# Patient Record
Sex: Female | Born: 1945 | Race: White | Hispanic: No | Marital: Married | Smoking: Never smoker
Health system: Southern US, Community
[De-identification: ages and names within clinical notes are randomized; demographics above are authoritative.]

## PROBLEM LIST (undated history)

## (undated) DIAGNOSIS — E039 Hypothyroidism, unspecified: Secondary | ICD-10-CM

## (undated) DIAGNOSIS — F419 Anxiety disorder, unspecified: Secondary | ICD-10-CM

## (undated) DIAGNOSIS — M1990 Unspecified osteoarthritis, unspecified site: Secondary | ICD-10-CM

## (undated) DIAGNOSIS — K219 Gastro-esophageal reflux disease without esophagitis: Secondary | ICD-10-CM

## (undated) DIAGNOSIS — H353190 Nonexudative age-related macular degeneration, unspecified eye, stage unspecified: Secondary | ICD-10-CM

## (undated) DIAGNOSIS — I10 Essential (primary) hypertension: Secondary | ICD-10-CM

## (undated) DIAGNOSIS — R112 Nausea with vomiting, unspecified: Secondary | ICD-10-CM

## (undated) DIAGNOSIS — Z9889 Other specified postprocedural states: Secondary | ICD-10-CM

## (undated) HISTORY — PX: MENISCUS REPAIR: SHX5179

## (undated) HISTORY — PX: KNEE ARTHROSCOPY: SHX127

## (undated) HISTORY — PX: TUBAL LIGATION: SHX77

---

## 1980-09-24 HISTORY — PX: TUBAL LIGATION: SHX77

## 2011-04-26 ENCOUNTER — Encounter: Payer: Self-pay | Admitting: Podiatry

## 2012-09-24 HISTORY — PX: BUNIONECTOMY: SHX129

## 2013-10-13 ENCOUNTER — Other Ambulatory Visit: Payer: Self-pay | Admitting: Orthopedic Surgery

## 2013-10-23 ENCOUNTER — Encounter (HOSPITAL_COMMUNITY): Payer: Self-pay | Admitting: Pharmacy Technician

## 2013-10-26 ENCOUNTER — Encounter (INDEPENDENT_AMBULATORY_CARE_PROVIDER_SITE_OTHER): Payer: Self-pay

## 2013-10-26 ENCOUNTER — Encounter (HOSPITAL_COMMUNITY): Payer: Self-pay

## 2013-10-26 ENCOUNTER — Encounter (HOSPITAL_COMMUNITY)
Admission: RE | Admit: 2013-10-26 | Discharge: 2013-10-26 | Disposition: A | Discharge status: Home / Self Care | Payer: Medicare Other | Source: Ambulatory Visit | Attending: Orthopedic Surgery | Admitting: Orthopedic Surgery

## 2013-10-26 DIAGNOSIS — Z01812 Encounter for preprocedural laboratory examination: Secondary | ICD-10-CM | POA: Insufficient documentation

## 2013-10-26 HISTORY — DX: Nonexudative age-related macular degeneration, unspecified eye, stage unspecified: H35.3190

## 2013-10-26 HISTORY — DX: Other specified postprocedural states: R11.2

## 2013-10-26 HISTORY — DX: Gastro-esophageal reflux disease without esophagitis: K21.9

## 2013-10-26 HISTORY — DX: Other specified postprocedural states: Z98.890

## 2013-10-26 HISTORY — DX: Hypothyroidism, unspecified: E03.9

## 2013-10-26 HISTORY — DX: Anxiety disorder, unspecified: F41.9

## 2013-10-26 HISTORY — DX: Unspecified osteoarthritis, unspecified site: M19.90

## 2013-10-26 LAB — URINALYSIS, ROUTINE W REFLEX MICROSCOPIC
Bilirubin Urine: NEGATIVE
Glucose, UA: NEGATIVE mg/dL
Hgb urine dipstick: NEGATIVE
Ketones, ur: NEGATIVE mg/dL
NITRITE: NEGATIVE
PH: 7.5 (ref 5.0–8.0)
Protein, ur: NEGATIVE mg/dL
SPECIFIC GRAVITY, URINE: 1.009 (ref 1.005–1.030)
Urobilinogen, UA: 0.2 mg/dL (ref 0.0–1.0)

## 2013-10-26 LAB — COMPREHENSIVE METABOLIC PANEL
ALBUMIN: 3.9 g/dL (ref 3.5–5.2)
ALK PHOS: 82 U/L (ref 39–117)
ALT: 21 U/L (ref 0–35)
AST: 26 U/L (ref 0–37)
BILIRUBIN TOTAL: 0.3 mg/dL (ref 0.3–1.2)
BUN: 11 mg/dL (ref 6–23)
CHLORIDE: 103 meq/L (ref 96–112)
CO2: 28 mEq/L (ref 19–32)
Calcium: 9.3 mg/dL (ref 8.4–10.5)
Creatinine, Ser: 0.65 mg/dL (ref 0.50–1.10)
GFR calc Af Amer: >90 mL/min (ref >90)
GFR calc non Af Amer: 89 mL/min — ABNORMAL LOW (ref >90)
Glucose, Bld: 91 mg/dL (ref 70–99)
POTASSIUM: 4.2 meq/L (ref 3.7–5.3)
SODIUM: 142 meq/L (ref 137–147)
Total Protein: 6.9 g/dL (ref 6.0–8.3)

## 2013-10-26 LAB — PROTIME-INR
INR: 0.93 (ref 0.00–1.49)
Prothrombin Time: 12.3 seconds (ref 11.6–15.2)

## 2013-10-26 LAB — CBC
HCT: 38.3 % (ref 36.0–46.0)
HEMOGLOBIN: 12.6 g/dL (ref 12.0–15.0)
MCH: 29.7 pg (ref 26.0–34.0)
MCHC: 32.9 g/dL (ref 30.0–36.0)
MCV: 90.3 fL (ref 78.0–100.0)
Platelets: 247 10*3/uL (ref 150–400)
RBC: 4.24 MIL/uL (ref 3.87–5.11)
RDW: 13 % (ref 11.5–15.5)
WBC: 5.8 10*3/uL (ref 4.0–10.5)

## 2013-10-26 LAB — SURGICAL PCR SCREEN
MRSA, PCR: NEGATIVE
Staphylococcus aureus: NEGATIVE

## 2013-10-26 LAB — URINE MICROSCOPIC-ADD ON

## 2013-10-26 LAB — APTT: aPTT: 26 seconds (ref 24–37)

## 2013-10-26 LAB — ABO/RH: ABO/RH(D): O POS

## 2013-10-26 NOTE — Patient Instructions (Addendum)
[address] Hollace HaywardSusan Clermont  10/26/2013   Your procedure is scheduled on: 11/02/13  [facility] at 09:40 AM.  Call this number if you have problems the morning of surgery 336-: [phone]   Remember: NO VISITORS UNDER AGE 69 DUE TO [place] POLICY   Do not eat food or drink liquids After Midnight.     Take these medicines the morning of surgery with A SIP OF WATER: prozac, levothyroxine   Do not wear jewelry, make-up or nail polish.  Do not wear lotions, powders, or perfumes. You may wear deodorant.  Do not shave 48 hours prior to surgery. Men may shave face and neck.  Do not bring valuables to the hospital.  Contacts, dentures or bridgework may not be worn into surgery.  Leave suitcase in the car. After surgery it may be brought to your room.  For patients admitted to the hospital, checkout time is 11:00 AM the day of discharge.   Please read over the following fact sheets that you were given: MRSA Information, incentive spirometry fact sheet, blood fact sheet Birdie Sonsachel Joia Doyle, RN  pre op nurse call if needed [phone]    FAILURE TO FOLLOW THESE INSTRUCTIONS MAY RESULT IN CANCELLATION OF YOUR SURGERY   Patient Signature: ___________________________________________

## 2013-10-26 NOTE — Progress Notes (Signed)
ekg 12/15/12 on chart, surgery clearance note 10/13/13 Dr. Fanny DanceVelasquez on chart

## 2013-11-01 ENCOUNTER — Other Ambulatory Visit: Payer: Self-pay | Admitting: Orthopedic Surgery

## 2013-11-01 NOTE — H&P (Signed)
Cathy HaywardSusan Fitzpatrick  DOB: 01/26/1946 Married / Language: Lenox Ponds[place] / Race: White Female  Date of Admission:  11-02-2013  Chief Complaint:  Left Knee Pain    History of Present Illness The patient is a 68 year old female who comes in for a preoperative History and Physical. The patient is scheduled for a left total knee arthroplasty to be performed by Dr. Gus RankinFrank V. Aluisio, MD at [facility] on 11-02-2013. The patient is a 68 year old female who presents for follow up of their knee. The patient is being followed for their left knee pain and osteoarthritis. They are now several month(s) out from the last cortisone injection. Symptoms reported today include: pain. The patient has reported improvement of their symptoms with: Cortisone injections (the last one did not help for long).   Cathy PikesSusan states the left knee is getting progressively worse over time. The cortisone provides a few weeks of benefit generally, maybe up to a month or so. She is at a stage now where the knee is inhibiting what she can and can not do. She feels as though the pain is getting progressively worse. It does hurt her at night. She is at a stage where she is ready for more permanent solution to this problem. She is ready to get the knee fixed. They have been treated conservatively in the past for the above stated problem and despite conservative measures, they continue to have progressive pain and severe functional limitations and dysfunction. They have failed non-operative management including home exercise, medications, and injections. It is felt that they would benefit from undergoing total joint replacement. Risks and benefits of the procedure have been discussed with the patient and they elect to proceed with surgery. There are no active contraindications to surgery such as ongoing infection or rapidly progressive neurological disease.  Allergies Codeine Sulfate *ANALGESICS - OPIOID*. Sickness - Please Note that the  patient IS able to take hydrocodone and oxycodone.   Problem List/Past Medical Primary osteoarthritis of one knee (715.16) Hypothyroidism Anxiety Disorder Macular Degeneration Degenerative Disc Disease    Family History Depression. mother Cancer. First Degree Relatives. mother and father Hypertension. sister Father. Deceased. age 69, Aspiration Pneunomia Mother. Deceased. age 69, Ruptured AAA, History of Renal Cancer    Social History Drug/Alcohol Rehab (Currently). no Illicit drug use. no Exercise. Exercises rarely Living situation. live with spouse Tobacco use. Never smoker. never smoker Number of flights of stairs before winded. 4-5 Marital status. married Previously in rehab. no Children. 0 Alcohol use. current drinker; drinks wine; less than 5 per week Pain Contract. no Current work status. retired    Medication History Aleve (prn) Active. Levothyroxine .025mg  Active. Fluoxetine 20mg  Active. Amitriptyline 25mg  Active. Tozal 3 caplets/day Active.   Past Surgical History Foot Surgery. bilateral   Review of Systems General:Not Present- Chills, Fever, Night Sweats, Fatigue, Weight Gain, Weight Loss and Memory Loss. Skin:Not Present- Hives, Itching, Rash, Eczema and Lesions. HEENT:Not Present- Tinnitus, Headache, Double Vision, Visual Loss, Hearing Loss and Dentures. Respiratory:Not Present- Shortness of breath with exertion, Shortness of breath at rest, Allergies, Coughing up blood and Chronic Cough. Cardiovascular:Not Present- Chest Pain, Racing/skipping heartbeats, Difficulty Breathing Lying Down, Murmur, Swelling and Palpitations. Gastrointestinal:Not Present- Bloody Stool, Heartburn, Abdominal Pain, Vomiting, Nausea, Constipation, Diarrhea, Difficulty Swallowing, Jaundice and Loss of appetitie. Female Genitourinary:Not Present- Blood in Urine, Urinary frequency, Weak urinary stream, Discharge, Flank Pain, Incontinence,  Painful Urination, Urgency, Urinary Retention and Urinating at Night. Musculoskeletal:Present- Joint Pain. Not Present- Muscle Weakness, Muscle Pain,  Joint Swelling, Back Pain, Morning Stiffness and Spasms. Neurological:Not Present- Tremor, Dizziness, Blackout spells, Paralysis, Difficulty with balance and Weakness. Psychiatric:Not Present- Insomnia.    Vitals Weight: 165 lb Height: 66 in Body Surface Area: 1.87 m Body Mass Index: 26.63 kg/m Pulse: 84 (Regular) Resp.: 16 (Unlabored) BP: 152/78 (Sitting, Right Arm, Standard)     Physical Exam The physical exam findings are as follows:   General Mental Status - Alert, cooperative and good historian. General Appearance- pleasant. Not in acute distress. Orientation- Oriented X3. Build & Nutrition- Well nourished and Well developed.   Head and Neck Head- normocephalic, atraumatic . Neck Global Assessment- supple. no bruit auscultated on the right and no bruit auscultated on the left.   Eye Vision- Wears corrective lenses. Pupil- Bilateral- Regular and Round. Motion- Bilateral- EOMI.   Chest and Lung Exam Auscultation: Breath sounds:- clear at anterior chest wall and - clear at posterior chest wall. Adventitious sounds:- No Adventitious sounds.   Cardiovascular Auscultation:Rhythm- Regular rate and rhythm. Heart Sounds- S1 WNL and S2 WNL. Murmurs & Other Heart Sounds:Auscultation of the heart reveals - No Murmurs.   Abdomen Palpation/Percussion:Tenderness- Abdomen is non-tender to palpation. Rigidity (guarding)- Abdomen is soft. Auscultation:Auscultation of the abdomen reveals - Bowel sounds normal.   Female Genitourinary Not done, not pertinent to present illness  Musculoskeletal Well developed female alert and oriented in no apparent distress. Evaluation of her left hip shows normal range of motion, no discomfort. The left knee no effusion. Slight varus deformity.  There is moderate crepitus on range of motion of the knee. She is tender medial greater than lateral. There is no instability noted about the knee. Pulses, sensation and motor are intact distally.  RADIOGRAPHS: Review of radiographs AP both knees and lateral of the left taken today show she has bone on bone arthritis medial and patellofemoral compartments of the left knee with varus deformity.   Assessment & Plan Primary osteoarthritis of one knee (715.16) Impression: Left Knee  Note: Plan is for a Left Total Knee Replacement by Dr. Lequita Halt.  Plan is to go home.  PCP - Dr. Guerry Bruin  The patient does not have any contraindications and will receive TXA (tranexamic acid) prior to surgery.  Signed electronically by Lauraine Rinne, III PA-C

## 2013-11-02 ENCOUNTER — Encounter (HOSPITAL_COMMUNITY)
Admission: RE | Disposition: A | Discharge status: Home Health | Payer: Self-pay | Source: Ambulatory Visit | Attending: Orthopedic Surgery

## 2013-11-02 ENCOUNTER — Encounter (HOSPITAL_COMMUNITY): Payer: Medicare Other | Admitting: Anesthesiology

## 2013-11-02 ENCOUNTER — Inpatient Hospital Stay (HOSPITAL_COMMUNITY): Payer: Medicare Other | Admitting: Anesthesiology

## 2013-11-02 ENCOUNTER — Inpatient Hospital Stay (HOSPITAL_COMMUNITY)
Admission: RE | Admit: 2013-11-02 | Discharge: 2013-11-04 | DRG: 470 | Disposition: A | Discharge status: Home Health | Payer: Medicare Other | Source: Ambulatory Visit | Attending: Orthopedic Surgery | Admitting: Orthopedic Surgery

## 2013-11-02 ENCOUNTER — Encounter (HOSPITAL_COMMUNITY): Payer: Self-pay | Admitting: *Deleted

## 2013-11-02 DIAGNOSIS — K219 Gastro-esophageal reflux disease without esophagitis: Secondary | ICD-10-CM | POA: Diagnosis present

## 2013-11-02 DIAGNOSIS — F411 Generalized anxiety disorder: Secondary | ICD-10-CM | POA: Diagnosis present

## 2013-11-02 DIAGNOSIS — E039 Hypothyroidism, unspecified: Secondary | ICD-10-CM | POA: Diagnosis present

## 2013-11-02 DIAGNOSIS — H3530 Unspecified macular degeneration: Secondary | ICD-10-CM | POA: Diagnosis present

## 2013-11-02 DIAGNOSIS — M1710 Unilateral primary osteoarthritis, unspecified knee: Principal | ICD-10-CM | POA: Diagnosis present

## 2013-11-02 DIAGNOSIS — D62 Acute posthemorrhagic anemia: Secondary | ICD-10-CM | POA: Diagnosis not present

## 2013-11-02 DIAGNOSIS — Z8249 Family history of ischemic heart disease and other diseases of the circulatory system: Secondary | ICD-10-CM

## 2013-11-02 DIAGNOSIS — Z96652 Presence of left artificial knee joint: Secondary | ICD-10-CM

## 2013-11-02 DIAGNOSIS — Z79899 Other long term (current) drug therapy: Secondary | ICD-10-CM

## 2013-11-02 DIAGNOSIS — E871 Hypo-osmolality and hyponatremia: Secondary | ICD-10-CM | POA: Diagnosis not present

## 2013-11-02 DIAGNOSIS — M179 Osteoarthritis of knee, unspecified: Secondary | ICD-10-CM | POA: Diagnosis present

## 2013-11-02 HISTORY — PX: TOTAL KNEE ARTHROPLASTY: SHX125

## 2013-11-02 LAB — TYPE AND SCREEN
ABO/RH(D): O POS
ANTIBODY SCREEN: NEGATIVE

## 2013-11-02 SURGERY — ARTHROPLASTY, KNEE, TOTAL
Anesthesia: Spinal | Site: Knee | Laterality: Left

## 2013-11-02 MED ORDER — BUPIVACAINE HCL (PF) 0.25 % IJ SOLN
INTRAMUSCULAR | Status: AC
  Filled 2013-11-02: qty 30

## 2013-11-02 MED ORDER — ACETAMINOPHEN 500 MG PO TABS
1000.0000 mg | ORAL_TABLET | Freq: Four times a day (QID) | ORAL | Status: AC
  Administered 2013-11-02 – 2013-11-03 (×4): 1000 mg via ORAL
  Filled 2013-11-02 (×5): qty 2

## 2013-11-02 MED ORDER — KETAMINE HCL 10 MG/ML IJ SOLN
INTRAMUSCULAR | Status: DC | PRN
  Administered 2013-11-02 (×2): 10 mg via INTRAVENOUS

## 2013-11-02 MED ORDER — PNEUMOCOCCAL VAC POLYVALENT 25 MCG/0.5ML IJ INJ
0.5000 mL | INJECTION | INTRAMUSCULAR | Status: DC
  Filled 2013-11-02 (×2): qty 0.5

## 2013-11-02 MED ORDER — PHENOL 1.4 % MT LIQD: 1.0000 | OROMUCOSAL | Status: DC | PRN

## 2013-11-02 MED ORDER — PROPOFOL 10 MG/ML IV EMUL
INTRAVENOUS | Status: DC | PRN
  Administered 2013-11-02: 20 mg via INTRAVENOUS

## 2013-11-02 MED ORDER — RIVAROXABAN 10 MG PO TABS
10.0000 mg | ORAL_TABLET | Freq: Every day | ORAL | Status: DC
  Administered 2013-11-03 – 2013-11-04 (×2): 10 mg via ORAL
  Filled 2013-11-02 (×3): qty 1

## 2013-11-02 MED ORDER — KETOROLAC TROMETHAMINE 15 MG/ML IJ SOLN
7.5000 mg | Freq: Four times a day (QID) | INTRAMUSCULAR | Status: AC | PRN
  Administered 2013-11-02 (×2): 7.5 mg via INTRAVENOUS
  Filled 2013-11-02: qty 1

## 2013-11-02 MED ORDER — SODIUM CHLORIDE 0.9 % IV SOLN: INTRAVENOUS | Status: DC

## 2013-11-02 MED ORDER — BUPIVACAINE LIPOSOME 1.3 % IJ SUSP
20.0000 mL | Freq: Once | INTRAMUSCULAR | Status: DC
Start: 2013-11-02 — End: 2013-11-02
  Filled 2013-11-02: qty 20

## 2013-11-02 MED ORDER — LACTATED RINGERS IV SOLN: INTRAVENOUS | Status: DC

## 2013-11-02 MED ORDER — PROPOFOL 10 MG/ML IV BOLUS
INTRAVENOUS | Status: AC
  Filled 2013-11-02: qty 20

## 2013-11-02 MED ORDER — MIDAZOLAM HCL 2 MG/2ML IJ SOLN
INTRAMUSCULAR | Status: AC
  Filled 2013-11-02: qty 2

## 2013-11-02 MED ORDER — TRANEXAMIC ACID 100 MG/ML IV SOLN
1000.0000 mg | INTRAVENOUS | Status: AC
  Administered 2013-11-02: 1000 mg via INTRAVENOUS
  Filled 2013-11-02: qty 10

## 2013-11-02 MED ORDER — BUPIVACAINE HCL (PF) 0.75 % IJ SOLN
INTRAMUSCULAR | Status: DC | PRN
  Administered 2013-11-02: 15 mg via INTRATHECAL

## 2013-11-02 MED ORDER — LIDOCAINE HCL (CARDIAC) 20 MG/ML IV SOLN
INTRAVENOUS | Status: AC
  Filled 2013-11-02: qty 5

## 2013-11-02 MED ORDER — DEXTROSE-NACL 5-0.45 % IV SOLN
INTRAVENOUS | Status: DC
  Administered 2013-11-02 – 2013-11-03 (×2): via INTRAVENOUS

## 2013-11-02 MED ORDER — DEXAMETHASONE 6 MG PO TABS
10.0000 mg | ORAL_TABLET | Freq: Every day | ORAL | Status: AC
  Filled 2013-11-02: qty 1

## 2013-11-02 MED ORDER — MENTHOL 3 MG MT LOZG
1.0000 | LOZENGE | OROMUCOSAL | Status: DC | PRN
Start: 2013-11-02 — End: 2013-11-04

## 2013-11-02 MED ORDER — METHOCARBAMOL 500 MG PO TABS
500.0000 mg | ORAL_TABLET | Freq: Four times a day (QID) | ORAL | Status: DC | PRN
  Administered 2013-11-02 – 2013-11-04 (×6): 500 mg via ORAL
  Filled 2013-11-02 (×6): qty 1

## 2013-11-02 MED ORDER — CEFAZOLIN SODIUM-DEXTROSE 2-3 GM-% IV SOLR
2.0000 g | INTRAVENOUS | Status: AC
  Administered 2013-11-02: 2 g via INTRAVENOUS

## 2013-11-02 MED ORDER — BISACODYL 10 MG RE SUPP: 10.0000 mg | Freq: Every day | RECTAL | Status: DC | PRN

## 2013-11-02 MED ORDER — CEFAZOLIN SODIUM-DEXTROSE 2-3 GM-% IV SOLR
2.0000 g | Freq: Four times a day (QID) | INTRAVENOUS | Status: AC
  Administered 2013-11-02 – 2013-11-03 (×2): 2 g via INTRAVENOUS
  Filled 2013-11-02 (×2): qty 50

## 2013-11-02 MED ORDER — ACETAMINOPHEN 500 MG PO TABS
1000.0000 mg | ORAL_TABLET | Freq: Once | ORAL | Status: AC
  Administered 2013-11-02: 1000 mg via ORAL
  Filled 2013-11-02: qty 2

## 2013-11-02 MED ORDER — BUPIVACAINE LIPOSOME 1.3 % IJ SUSP
INTRAMUSCULAR | Status: DC | PRN
  Administered 2013-11-02: 20 mL

## 2013-11-02 MED ORDER — SODIUM CHLORIDE 0.9 % IJ SOLN
INTRAMUSCULAR | Status: DC | PRN
  Administered 2013-11-02: 30 mL

## 2013-11-02 MED ORDER — FLEET ENEMA 7-19 GM/118ML RE ENEM: 1.0000 | ENEMA | Freq: Once | RECTAL | Status: AC | PRN

## 2013-11-02 MED ORDER — METOCLOPRAMIDE HCL 10 MG PO TABS
5.0000 mg | ORAL_TABLET | Freq: Three times a day (TID) | ORAL | Status: DC | PRN
  Administered 2013-11-04: 10 mg via ORAL
  Filled 2013-11-02: qty 1

## 2013-11-02 MED ORDER — ONDANSETRON HCL 4 MG PO TABS
4.0000 mg | ORAL_TABLET | Freq: Four times a day (QID) | ORAL | Status: DC | PRN
  Administered 2013-11-04: 4 mg via ORAL
  Filled 2013-11-02: qty 1

## 2013-11-02 MED ORDER — METHOCARBAMOL 100 MG/ML IJ SOLN
500.0000 mg | Freq: Four times a day (QID) | INTRAVENOUS | Status: DC | PRN
  Administered 2013-11-02: 500 mg via INTRAVENOUS
  Filled 2013-11-02: qty 5

## 2013-11-02 MED ORDER — DOCUSATE SODIUM 100 MG PO CAPS
100.0000 mg | ORAL_CAPSULE | Freq: Two times a day (BID) | ORAL | Status: DC
  Administered 2013-11-02 – 2013-11-04 (×4): 100 mg via ORAL

## 2013-11-02 MED ORDER — ACETAMINOPHEN 650 MG RE SUPP: 650.0000 mg | Freq: Four times a day (QID) | RECTAL | Status: DC | PRN

## 2013-11-02 MED ORDER — HYDROMORPHONE HCL PF 1 MG/ML IJ SOLN
0.2500 mg | INTRAMUSCULAR | Status: DC | PRN
  Administered 2013-11-02 (×4): 0.5 mg via INTRAVENOUS

## 2013-11-02 MED ORDER — MIDAZOLAM HCL 5 MG/5ML IJ SOLN
INTRAMUSCULAR | Status: DC | PRN
  Administered 2013-11-02: 2 mg via INTRAVENOUS

## 2013-11-02 MED ORDER — BUPIVACAINE HCL 0.25 % IJ SOLN
INTRAMUSCULAR | Status: DC | PRN
  Administered 2013-11-02: 20 mL

## 2013-11-02 MED ORDER — HYDROMORPHONE HCL PF 1 MG/ML IJ SOLN
INTRAMUSCULAR | Status: AC
  Filled 2013-11-02: qty 1

## 2013-11-02 MED ORDER — CHLORHEXIDINE GLUCONATE CLOTH 2 % EX PADS: 6.0000 | MEDICATED_PAD | Freq: Once | CUTANEOUS | Status: DC

## 2013-11-02 MED ORDER — LEVOTHYROXINE SODIUM 25 MCG PO TABS
25.0000 ug | ORAL_TABLET | Freq: Every day | ORAL | Status: DC
  Administered 2013-11-03 – 2013-11-04 (×2): 25 ug via ORAL
  Filled 2013-11-02 (×3): qty 1

## 2013-11-02 MED ORDER — SODIUM CHLORIDE 0.9 % IJ SOLN
INTRAMUSCULAR | Status: AC
  Filled 2013-11-02: qty 50

## 2013-11-02 MED ORDER — PROPOFOL INFUSION 10 MG/ML OPTIME
INTRAVENOUS | Status: DC | PRN
  Administered 2013-11-02: 75 ug/kg/min via INTRAVENOUS

## 2013-11-02 MED ORDER — CEFAZOLIN SODIUM-DEXTROSE 2-3 GM-% IV SOLR
INTRAVENOUS | Status: AC
  Filled 2013-11-02: qty 50

## 2013-11-02 MED ORDER — ACETAMINOPHEN 325 MG PO TABS: 650.0000 mg | ORAL_TABLET | Freq: Four times a day (QID) | ORAL | Status: DC | PRN

## 2013-11-02 MED ORDER — DIPHENHYDRAMINE HCL 12.5 MG/5ML PO ELIX: 12.5000 mg | ORAL_SOLUTION | ORAL | Status: DC | PRN

## 2013-11-02 MED ORDER — DEXAMETHASONE SODIUM PHOSPHATE 10 MG/ML IJ SOLN
INTRAMUSCULAR | Status: AC
  Filled 2013-11-02: qty 1

## 2013-11-02 MED ORDER — ONDANSETRON HCL 4 MG/2ML IJ SOLN
4.0000 mg | Freq: Four times a day (QID) | INTRAMUSCULAR | Status: DC | PRN
  Administered 2013-11-03: 4 mg via INTRAVENOUS
  Filled 2013-11-02: qty 2

## 2013-11-02 MED ORDER — KETOROLAC TROMETHAMINE 15 MG/ML IJ SOLN
INTRAMUSCULAR | Status: AC
  Filled 2013-11-02: qty 1

## 2013-11-02 MED ORDER — LACTATED RINGERS IV SOLN
INTRAVENOUS | Status: DC | PRN
  Administered 2013-11-02 (×2): via INTRAVENOUS

## 2013-11-02 MED ORDER — DEXAMETHASONE SODIUM PHOSPHATE 10 MG/ML IJ SOLN
10.0000 mg | Freq: Every day | INTRAMUSCULAR | Status: AC
  Administered 2013-11-03: 10 mg via INTRAVENOUS
  Filled 2013-11-02: qty 1

## 2013-11-02 MED ORDER — FLUOXETINE HCL 20 MG PO TABS
20.0000 mg | ORAL_TABLET | Freq: Every morning | ORAL | Status: DC
  Administered 2013-11-03 – 2013-11-04 (×2): 20 mg via ORAL
  Filled 2013-11-02 (×2): qty 1

## 2013-11-02 MED ORDER — METOCLOPRAMIDE HCL 5 MG/ML IJ SOLN: 5.0000 mg | Freq: Three times a day (TID) | INTRAMUSCULAR | Status: DC | PRN

## 2013-11-02 MED ORDER — POLYETHYLENE GLYCOL 3350 17 G PO PACK: 17.0000 g | PACK | Freq: Every day | ORAL | Status: DC | PRN

## 2013-11-02 MED ORDER — OXYCODONE HCL 5 MG PO TABS
5.0000 mg | ORAL_TABLET | ORAL | Status: DC | PRN
  Administered 2013-11-02 – 2013-11-03 (×8): 10 mg via ORAL
  Filled 2013-11-02 (×8): qty 2

## 2013-11-02 MED ORDER — ONDANSETRON HCL 4 MG/2ML IJ SOLN
INTRAMUSCULAR | Status: DC | PRN
  Administered 2013-11-02: 4 mg via INTRAVENOUS

## 2013-11-02 MED ORDER — MORPHINE SULFATE 2 MG/ML IJ SOLN
1.0000 mg | INTRAMUSCULAR | Status: DC | PRN
  Administered 2013-11-02 – 2013-11-03 (×2): 2 mg via INTRAVENOUS
  Filled 2013-11-02 (×2): qty 1

## 2013-11-02 MED ORDER — DEXAMETHASONE SODIUM PHOSPHATE 10 MG/ML IJ SOLN
10.0000 mg | Freq: Once | INTRAMUSCULAR | Status: AC
  Administered 2013-11-02: 10 mg via INTRAVENOUS

## 2013-11-02 MED ORDER — AMITRIPTYLINE HCL 25 MG PO TABS
25.0000 mg | ORAL_TABLET | Freq: Every day | ORAL | Status: DC
  Administered 2013-11-02 – 2013-11-03 (×2): 25 mg via ORAL
  Filled 2013-11-02 (×3): qty 1

## 2013-11-02 MED ORDER — ONDANSETRON HCL 4 MG/2ML IJ SOLN
INTRAMUSCULAR | Status: AC
  Filled 2013-11-02: qty 2

## 2013-11-02 SURGICAL SUPPLY — 55 items
BAG ZIPLOCK 12X15 (MISCELLANEOUS) ×3 IMPLANT
BANDAGE ELASTIC 6 VELCRO ST LF (GAUZE/BANDAGES/DRESSINGS) ×3 IMPLANT
BANDAGE ESMARK 6X9 LF (GAUZE/BANDAGES/DRESSINGS) ×1 IMPLANT
BENZOIN TINCTURE PRP APPL 2/3 (GAUZE/BANDAGES/DRESSINGS) ×3 IMPLANT
BLADE SAG 18X100X1.27 (BLADE) ×3 IMPLANT
BLADE SAW SGTL 11.0X1.19X90.0M (BLADE) ×3 IMPLANT
BNDG ESMARK 6X9 LF (GAUZE/BANDAGES/DRESSINGS) ×3
BOWL SMART MIX CTS (DISPOSABLE) ×3 IMPLANT
CAPT RP KNEE ×3 IMPLANT
CEMENT HV SMART SET (Cement) ×6 IMPLANT
CLOSURE WOUND 1/2 X4 (GAUZE/BANDAGES/DRESSINGS) ×1
CUFF TOURN SGL QUICK 34 (TOURNIQUET CUFF) ×2
CUFF TRNQT CYL 34X4X40X1 (TOURNIQUET CUFF) ×1 IMPLANT
DECANTER SPIKE VIAL GLASS SM (MISCELLANEOUS) ×3 IMPLANT
DRAPE EXTREMITY T 121X128X90 (DRAPE) ×3 IMPLANT
DRAPE POUCH INSTRU U-SHP 10X18 (DRAPES) ×3 IMPLANT
DRAPE U-SHAPE 47X51 STRL (DRAPES) ×3 IMPLANT
DRSG ADAPTIC 3X8 NADH LF (GAUZE/BANDAGES/DRESSINGS) ×3 IMPLANT
DURAPREP 26ML APPLICATOR (WOUND CARE) ×3 IMPLANT
ELECT REM PT RETURN 9FT ADLT (ELECTROSURGICAL) ×3
ELECTRODE REM PT RTRN 9FT ADLT (ELECTROSURGICAL) ×1 IMPLANT
EVACUATOR 1/8 PVC DRAIN (DRAIN) ×3 IMPLANT
FACESHIELD LNG OPTICON STERILE (SAFETY) ×15 IMPLANT
GLOVE BIO SURGEON STRL SZ7.5 (GLOVE) IMPLANT
GLOVE BIO SURGEON STRL SZ8 (GLOVE) ×3 IMPLANT
GLOVE BIOGEL PI IND STRL 8 (GLOVE) ×2 IMPLANT
GLOVE BIOGEL PI INDICATOR 8 (GLOVE) ×4
GLOVE SURG SS PI 6.5 STRL IVOR (GLOVE) IMPLANT
GOWN STRL REUS W/TWL LRG LVL3 (GOWN DISPOSABLE) ×3 IMPLANT
GOWN STRL REUS W/TWL XL LVL3 (GOWN DISPOSABLE) IMPLANT
HANDPIECE INTERPULSE COAX TIP (DISPOSABLE) ×2
IMMOBILIZER KNEE 20 (SOFTGOODS) ×3 IMPLANT
KIT BASIN OR (CUSTOM PROCEDURE TRAY) ×3 IMPLANT
MANIFOLD NEPTUNE II (INSTRUMENTS) ×3 IMPLANT
NDL SAFETY ECLIPSE 18X1.5 (NEEDLE) ×2 IMPLANT
NEEDLE HYPO 18GX1.5 SHARP (NEEDLE) ×4
NS IRRIG 1000ML POUR BTL (IV SOLUTION) ×3 IMPLANT
PACK TOTAL JOINT (CUSTOM PROCEDURE TRAY) ×3 IMPLANT
PAD ABD 8X10 STRL (GAUZE/BANDAGES/DRESSINGS) ×3 IMPLANT
PADDING CAST COTTON 6X4 STRL (CAST SUPPLIES) ×3 IMPLANT
POSITIONER SURGICAL ARM (MISCELLANEOUS) ×3 IMPLANT
SET HNDPC FAN SPRY TIP SCT (DISPOSABLE) ×1 IMPLANT
SPONGE GAUZE 4X4 12PLY (GAUZE/BANDAGES/DRESSINGS) ×3 IMPLANT
STRIP CLOSURE SKIN 1/2X4 (GAUZE/BANDAGES/DRESSINGS) ×2 IMPLANT
SUCTION FRAZIER 12FR DISP (SUCTIONS) ×3 IMPLANT
SUT MNCRL AB 4-0 PS2 18 (SUTURE) ×3 IMPLANT
SUT VIC AB 2-0 CT1 27 (SUTURE) ×6
SUT VIC AB 2-0 CT1 TAPERPNT 27 (SUTURE) ×3 IMPLANT
SUT VLOC 180 0 24IN GS25 (SUTURE) ×3 IMPLANT
SYR 20CC LL (SYRINGE) ×3 IMPLANT
SYR 50ML LL SCALE MARK (SYRINGE) ×3 IMPLANT
TOWEL OR 17X26 10 PK STRL BLUE (TOWEL DISPOSABLE) ×6 IMPLANT
TRAY FOLEY CATH 14FRSI W/METER (CATHETERS) ×3 IMPLANT
WATER STERILE IRR 1500ML POUR (IV SOLUTION) ×3 IMPLANT
WRAP KNEE MAXI GEL POST OP (GAUZE/BANDAGES/DRESSINGS) ×3 IMPLANT

## 2013-11-02 NOTE — Interval H&P Note (Signed)
History and Physical Interval Note:  11/02/2013 9:45 AM  Cathy HaywardSusan Karr  has presented today for surgery, with the diagnosis of Osteoarthritis of the left knee  The various methods of treatment have been discussed with the patient and family. After consideration of risks, benefits and other options for treatment, the patient has consented to  Procedure(s): LEFT TOTAL KNEE ARTHROPLASTY (Left) as a surgical intervention .  The patient's history has been reviewed, patient examined, no change in status, stable for surgery.  I have reviewed the patient's chart and labs.  Questions were answered to the patient's satisfaction.     Loanne DrillingALUISIO,Elias Bordner V

## 2013-11-02 NOTE — Anesthesia Preprocedure Evaluation (Addendum)
Anesthesia Evaluation  Patient identified by MRN, date of birth, ID band Patient awake    Reviewed: Allergy & Precautions, H&P , NPO status , Patient's Chart, lab work & pertinent test results  History of Anesthesia Complications (+) PONV  Airway Mallampati: II TM Distance: >3 FB Neck ROM: full    Dental no notable dental hx. (+) Teeth Intact and Dental Advisory Given   Pulmonary neg pulmonary ROS,  breath sounds clear to auscultation  Pulmonary exam normal       Cardiovascular Exercise Tolerance: Good negative cardio ROS  Rhythm:regular Rate:Normal     Neuro/Psych Anxiety Macular degeneration negative neurological ROS  negative psych ROS   GI/Hepatic negative GI ROS, Neg liver ROS, GERD-  Medicated and Controlled,  Endo/Other  negative endocrine ROSHypothyroidism   Renal/GU negative Renal ROS  negative genitourinary   Musculoskeletal   Abdominal   Peds  Hematology negative hematology ROS (+)   Anesthesia Other Findings   Reproductive/Obstetrics negative OB ROS                          Anesthesia Physical Anesthesia Plan  ASA: II  Anesthesia Plan: Spinal   Post-op Pain Management:    Induction:   Airway Management Planned: Simple Face Mask  Additional Equipment:   Intra-op Plan:   Post-operative Plan:   Informed Consent: I have reviewed the patients History and Physical, chart, labs and discussed the procedure including the risks, benefits and alternatives for the proposed anesthesia with the patient or authorized representative who has indicated his/her understanding and acceptance.   Dental Advisory Given  Plan Discussed with: CRNA and Surgeon  Anesthesia Plan Comments:        Anesthesia Quick Evaluation

## 2013-11-02 NOTE — H&P (View-Only) (Signed)
Cathy Fitzpatrick  DOB: 01/26/1946 Married / Language: Len[place] PondsEnglish / Race: White Female  Date of Admission:  11-02-2013  Chief Complaint:  Left Knee Pain    History of Present Illness The patient is a 68 year old female who comes in for a preoperative History and Physical. The patient is scheduled for a left total knee arthroplasty to be performed by Dr. Gus RankinFrank V. Aluisio, MD at [facility] on 11-02-2013. The patient is a 68 year old female who presents for follow up of their knee. The patient is being followed for their left knee pain and osteoarthritis. They are now several month(s) out from the last cortisone injection. Symptoms reported today include: pain. The patient has reported improvement of their symptoms with: Cortisone injections (the last one did not help for long).   Cathy Fitzpatrick states the left knee is getting progressively worse over time. The cortisone provides a few weeks of benefit generally, maybe up to a month or so. She is at a stage now where the knee is inhibiting what she can and can not do. She feels as though the pain is getting progressively worse. It does hurt her at night. She is at a stage where she is ready for more permanent solution to this problem. She is ready to get the knee fixed. They have been treated conservatively in the past for the above stated problem and despite conservative measures, they continue to have progressive pain and severe functional limitations and dysfunction. They have failed non-operative management including home exercise, medications, and injections. It is felt that they would benefit from undergoing total joint replacement. Risks and benefits of the procedure have been discussed with the patient and they elect to proceed with surgery. There are no active contraindications to surgery such as ongoing infection or rapidly progressive neurological disease.  Allergies Codeine Sulfate *ANALGESICS - OPIOID*. Sickness - Please Note that the  patient IS able to take hydrocodone and oxycodone.   Problem List/Past Medical Primary osteoarthritis of one knee (715.16) Hypothyroidism Anxiety Disorder Macular Degeneration Degenerative Disc Disease    Family History Depression. mother Cancer. First Degree Relatives. mother and father Hypertension. sister Father. Deceased. age 69, Aspiration Pneunomia Mother. Deceased. age 69, Ruptured AAA, History of Renal Cancer    Social History Drug/Alcohol Rehab (Currently). no Illicit drug use. no Exercise. Exercises rarely Living situation. live with spouse Tobacco use. Never smoker. never smoker Number of flights of stairs before winded. 4-5 Marital status. married Previously in rehab. no Children. 0 Alcohol use. current drinker; drinks wine; less than 5 per week Pain Contract. no Current work status. retired    Medication History Aleve (prn) Active. Levothyroxine .025mg  Active. Fluoxetine 20mg  Active. Amitriptyline 25mg  Active. Tozal 3 caplets/day Active.   Past Surgical History Foot Surgery. bilateral   Review of Systems General:Not Present- Chills, Fever, Night Sweats, Fatigue, Weight Gain, Weight Loss and Memory Loss. Skin:Not Present- Hives, Itching, Rash, Eczema and Lesions. HEENT:Not Present- Tinnitus, Headache, Double Vision, Visual Loss, Hearing Loss and Dentures. Respiratory:Not Present- Shortness of breath with exertion, Shortness of breath at rest, Allergies, Coughing up blood and Chronic Cough. Cardiovascular:Not Present- Chest Pain, Racing/skipping heartbeats, Difficulty Breathing Lying Down, Murmur, Swelling and Palpitations. Gastrointestinal:Not Present- Bloody Stool, Heartburn, Abdominal Pain, Vomiting, Nausea, Constipation, Diarrhea, Difficulty Swallowing, Jaundice and Loss of appetitie. Female Genitourinary:Not Present- Blood in Urine, Urinary frequency, Weak urinary stream, Discharge, Flank Pain, Incontinence,  Painful Urination, Urgency, Urinary Retention and Urinating at Night. Musculoskeletal:Present- Joint Pain. Not Present- Muscle Weakness, Muscle Pain,  Joint Swelling, Back Pain, Morning Stiffness and Spasms. Neurological:Not Present- Tremor, Dizziness, Blackout spells, Paralysis, Difficulty with balance and Weakness. Psychiatric:Not Present- Insomnia.    Vitals Weight: 165 lb Height: 66 in Body Surface Area: 1.87 m Body Mass Index: 26.63 kg/m Pulse: 84 (Regular) Resp.: 16 (Unlabored) BP: 152/78 (Sitting, Right Arm, Standard)     Physical Exam The physical exam findings are as follows:   General Mental Status - Alert, cooperative and good historian. General Appearance- pleasant. Not in acute distress. Orientation- Oriented X3. Build & Nutrition- Well nourished and Well developed.   Head and Neck Head- normocephalic, atraumatic . Neck Global Assessment- supple. no bruit auscultated on the right and no bruit auscultated on the left.   Eye Vision- Wears corrective lenses. Pupil- Bilateral- Regular and Round. Motion- Bilateral- EOMI.   Chest and Lung Exam Auscultation: Breath sounds:- clear at anterior chest wall and - clear at posterior chest wall. Adventitious sounds:- No Adventitious sounds.   Cardiovascular Auscultation:Rhythm- Regular rate and rhythm. Heart Sounds- S1 WNL and S2 WNL. Murmurs & Other Heart Sounds:Auscultation of the heart reveals - No Murmurs.   Abdomen Palpation/Percussion:Tenderness- Abdomen is non-tender to palpation. Rigidity (guarding)- Abdomen is soft. Auscultation:Auscultation of the abdomen reveals - Bowel sounds normal.   Female Genitourinary Not done, not pertinent to present illness  Musculoskeletal Well developed female alert and oriented in no apparent distress. Evaluation of her left hip shows normal range of motion, no discomfort. The left knee no effusion. Slight varus deformity.  There is moderate crepitus on range of motion of the knee. She is tender medial greater than lateral. There is no instability noted about the knee. Pulses, sensation and motor are intact distally.  RADIOGRAPHS: Review of radiographs AP both knees and lateral of the left taken today show she has bone on bone arthritis medial and patellofemoral compartments of the left knee with varus deformity.   Assessment & Plan Primary osteoarthritis of one knee (715.16) Impression: Left Knee  Note: Plan is for a Left Total Knee Replacement by Dr. Lequita Halt.  Plan is to go home.  PCP - Dr. Guerry Bruin  The patient does not have any contraindications and will receive TXA (tranexamic acid) prior to surgery.  Signed electronically by Lauraine Rinne, III PA-C

## 2013-11-02 NOTE — Anesthesia Postprocedure Evaluation (Signed)
  Anesthesia Post-op Note  Patient: Cathy Fitzpatrick  Procedure(s) Performed: Procedure(s) (LRB): LEFT TOTAL KNEE ARTHROPLASTY (Left)  Patient Location: PACU  Anesthesia Type: Spinal  Level of Consciousness: awake and alert   Airway and Oxygen Therapy: Patient Spontanous Breathing  Post-op Pain: mild  Post-op Assessment: Post-op Vital signs reviewed, Patient's Cardiovascular Status Stable, Respiratory Function Stable, Patent Airway and No signs of Nausea or vomiting  Last Vitals:  Filed Vitals:   11/02/13 1530  BP: 151/69  Pulse: 82  Temp: 36.8 C  Resp: 12    Post-op Vital Signs: stable   Complications: No apparent anesthesia complications

## 2013-11-02 NOTE — Anesthesia Procedure Notes (Signed)
Spinal  Patient location during procedure: OR Start time: 11/02/2013 1:05 PM End time: 11/02/2013 1:15 PM Staffing Anesthesiologist: Rod Mae L Performed by: anesthesiologist  Preanesthetic Checklist Completed: patient identified, site marked, surgical consent, pre-op evaluation, timeout performed, IV checked, risks and benefits discussed and monitors and equipment checked Spinal Block Patient position: sitting Prep: Betadine Patient monitoring: heart rate, continuous pulse ox and blood pressure Approach: midline Location: L4-5 Injection technique: single-shot Needle Needle type: Spinocan  Needle gauge: 22 G Needle length: 9 cm Assessment Sensory level: T6 Additional Notes Expiration date of kit checked and confirmed. Patient tolerated procedure well, without complications.

## 2013-11-02 NOTE — Transfer of Care (Signed)
Immediate Anesthesia Transfer of Care Note  Patient: Cathy Fitzpatrick  Procedure(s) Performed: Procedure(s): LEFT TOTAL KNEE ARTHROPLASTY (Left)  Patient Location: PACU  Anesthesia Type:Spinal  Level of Consciousness: awake, alert  and oriented  Airway & Oxygen Therapy: Patient Spontanous Breathing and Patient connected to face mask oxygen  Post-op Assessment: Report given to PACU RN and Post -op Vital signs reviewed and stable  Post vital signs: Reviewed and stable  Complications: No apparent anesthesia complications

## 2013-11-02 NOTE — Op Note (Signed)
Pre-operative diagnosis- Osteoarthritis  Left knee(s)  Post-operative diagnosis- Osteoarthritis Left knee(s)  Procedure-  Left  Total Knee Arthroplasty  Surgeon- Gus Rankin. Alonah Lineback, MD  Assistant- Avel Peace, PA-C   Anesthesia-  Spinal EBL-* No blood loss amount entered *  Drains Hemovac  Tourniquet time-  Total Tourniquet Time Documented: Thigh (Left) - 30 minutes Total: Thigh (Left) - 30 minutes    Complications- None  Condition-PACU - hemodynamically stable.   Brief Clinical Note  Cathy Fitzpatrick is a 68 y.o. year old female with end stage OA of her left knee with progressively worsening pain and dysfunction. She has constant pain, with activity and at rest and significant functional deficits with difficulties even with ADLs. She has had extensive non-op management including analgesics, injections of cortisone and viscosupplements, and home exercise program, but remains in significant pain with significant dysfunction. Radiographs show bone on bone arthritis all 3 compartments. She presents now for left Total Knee Arthroplasty.     Procedure in detail---   The patient is brought into the operating room and positioned supine on the operating table. After successful administration of  Spinal,   a tourniquet is placed high on the  Left thigh(s) and the lower extremity is prepped and draped in the usual sterile fashion. Time out is performed by the operating team and then the  Left lower extremity is wrapped in Esmarch, knee flexed and the tourniquet inflated to 300 mmHg.       A midline incision is made with a ten blade through the subcutaneous tissue to the level of the extensor mechanism. A fresh blade is used to make a medial parapatellar arthrotomy. Soft tissue over the proximal medial tibia is subperiosteally elevated to the joint line with a knife and into the semimembranosus bursa with a Cobb elevator. Soft tissue over the proximal lateral tibia is elevated with attention being paid  to avoiding the patellar tendon on the tibial tubercle. The patella is everted, knee flexed 90 degrees and the ACL and PCL are removed. Findings are bone on bone all 3 compartments with massive global osteophytes.        The drill is used to create a starting hole in the distal femur and the canal is thoroughly irrigated with sterile saline to remove the fatty contents. The 5 degree Left  valgus alignment guide is placed into the femoral canal and the distal femoral cutting block is pinned to remove 10 mm off the distal femur. Resection is made with an oscillating saw.      The tibia is subluxed forward and the menisci are removed. The extramedullary alignment guide is placed referencing proximally at the medial aspect of the tibial tubercle and distally along the second metatarsal axis and tibial crest. The block is pinned to remove 2mm off the more deficient medial  side. Resection is made with an oscillating saw. Size 3is the most appropriate size for the tibia and the proximal tibia is prepared with the modular drill and keel punch for that size.      The femoral sizing guide is placed and size 3 is most appropriate. Rotation is marked off the epicondylar axis and confirmed by creating a rectangular flexion gap at 90 degrees. The size 3 cutting block is pinned in this rotation and the anterior, posterior and chamfer cuts are made with the oscillating saw. The intercondylar block is then placed and that cut is made.      Trial size 3 tibial component, trial size 3  posterior stabilized femur and a 12.5  mm posterior stabilized rotating platform insert trial is placed. Full extension is achieved with excellent varus/valgus and anterior/posterior balance throughout full range of motion. The patella is everted and thickness measured to be 22  mm. Free hand resection is taken to 12 mm, a 38 template is placed, lug holes are drilled, trial patella is placed, and it tracks normally. Osteophytes are removed off the  posterior femur with the trial in place. All trials are removed and the cut bone surfaces prepared with pulsatile lavage. Cement is mixed and once ready for implantation, the size 3 tibial implant, size  3 posterior stabilized femoral component, and the size 38 patella are cemented in place and the patella is held with the clamp. The trial insert is placed and the knee held in full extension. The Exparel (20 ml mixed with 30 ml saline) and .25% Bupivicaine, are injected into the extensor mechanism, posterior capsule, medial and lateral gutters and subcutaneous tissues.  All extruded cement is removed and once the cement is hard the permanent 12.5 mm posterior stabilized rotating platform insert is placed into the tibial tray.      The wound is copiously irrigated with saline solution and the extensor mechanism closed over a hemovac drain with #1 PDS suture. The tourniquet is released for a total tourniquet time of 29  minutes. Flexion against gravity is 140 degrees and the patella tracks normally. Subcutaneous tissue is closed with 2.0 vicryl and subcuticular with running 4.0 Monocryl. The incision is cleaned and dried and steri-strips and a bulky sterile dressing are applied. The limb is placed into a knee immobilizer and the patient is awakened and transported to recovery in stable condition.      Please note that a surgical assistant was a medical necessity for this procedure in order to perform it in a safe and expeditious manner. Surgical assistant was necessary to retract the ligaments and vital neurovascular structures to prevent injury to them and also necessary for proper positioning of the limb to allow for anatomic placement of the prosthesis.   Gus RankinFrank V. Cleveland Yarbro, MD    11/02/2013, 2:06 PM

## 2013-11-02 NOTE — Preoperative (Signed)
Beta Blockers   Reason not to administer Beta Blockers:Not Applicable 

## 2013-11-03 DIAGNOSIS — D62 Acute posthemorrhagic anemia: Secondary | ICD-10-CM | POA: Diagnosis not present

## 2013-11-03 DIAGNOSIS — E871 Hypo-osmolality and hyponatremia: Secondary | ICD-10-CM | POA: Diagnosis not present

## 2013-11-03 LAB — BASIC METABOLIC PANEL
BUN: 12 mg/dL (ref 6–23)
CO2: 26 mEq/L (ref 19–32)
Calcium: 8.6 mg/dL (ref 8.4–10.5)
Chloride: 98 mEq/L (ref 96–112)
Creatinine, Ser: 0.6 mg/dL (ref 0.50–1.10)
GFR calc Af Amer: >90 mL/min (ref >90)
GFR calc non Af Amer: >90 mL/min (ref >90)
GLUCOSE: 155 mg/dL — AB (ref 70–99)
POTASSIUM: 4.3 meq/L (ref 3.7–5.3)
Sodium: 134 mEq/L — ABNORMAL LOW (ref 137–147)

## 2013-11-03 LAB — CBC
HCT: 32.2 % — ABNORMAL LOW (ref 36.0–46.0)
HEMOGLOBIN: 10.5 g/dL — AB (ref 12.0–15.0)
MCH: 29.5 pg (ref 26.0–34.0)
MCHC: 32.6 g/dL (ref 30.0–36.0)
MCV: 90.4 fL (ref 78.0–100.0)
Platelets: 216 10*3/uL (ref 150–400)
RBC: 3.56 MIL/uL — ABNORMAL LOW (ref 3.87–5.11)
RDW: 12.7 % (ref 11.5–15.5)
WBC: 8.9 10*3/uL (ref 4.0–10.5)

## 2013-11-03 MED ORDER — PNEUMOCOCCAL VAC POLYVALENT 25 MCG/0.5ML IJ INJ
0.5000 mL | INJECTION | INTRAMUSCULAR | Status: DC
  Filled 2013-11-03 (×2): qty 0.5

## 2013-11-03 MED ORDER — HYDROCODONE-ACETAMINOPHEN 5-325 MG PO TABS
1.0000 | ORAL_TABLET | ORAL | Status: DC | PRN
  Administered 2013-11-03 – 2013-11-04 (×3): 2 via ORAL
  Filled 2013-11-03 (×3): qty 2

## 2013-11-03 NOTE — Progress Notes (Signed)
   Subjective: 1 Day Post-Op Procedure(s) (LRB): LEFT TOTAL KNEE ARTHROPLASTY (Left) Patient reports no pain but she did not get much sleep. Patient seen in rounds with Dr. Lequita HaltAluisio. Patient is well, and has had no acute complaints or problems We will start therapy today.  Plan is to go Home after hospital stay.  Objective: Vital signs in last 24 hours: Temp:  [97.3 F (36.3 C)-98.2 F (36.8 C)] 97.3 F (36.3 C) (02/10 0500) Pulse Rate:  [74-96] 78 (02/10 0500) Resp:  [11-20] 16 (02/10 0500) BP: (120-172)/(63-90) 131/74 mmHg (02/10 0500) SpO2:  [98 %-100 %] 100 % (02/10 0500) Weight:  [78.297 kg (172 lb 9.8 oz)] 78.297 kg (172 lb 9.8 oz) (02/09 2000)  Intake/Output from previous day:  Intake/Output Summary (Last 24 hours) at 11/03/13 0831 Last data filed at 11/03/13 [id number]  Gross per 24 hour  Intake   1815 ml  Output   1330 ml  Net    485 ml    Intake/Output this shift:    Labs:  Recent Labs  11/03/13 0430  HGB 10.5*    Recent Labs  11/03/13 0430  WBC 8.9  RBC 3.56*  HCT 32.2*  PLT 216    Recent Labs  11/03/13 0430  NA 134*  K 4.3  CL 98  CO2 26  BUN 12  CREATININE 0.60  GLUCOSE 155*  CALCIUM 8.6   No results found for this basename: LABPT, INR,  in the last 72 hours  EXAM General - Patient is Alert, Appropriate and Oriented Extremity - Neurovascular intact Sensation intact distally Dressing - dressing C/D/I Motor Function - intact, moving foot and toes well on exam.  Hemovac pulled without difficulty.  Past Medical History  Diagnosis Date  . Macular degeneration, dry     right side, slight  . GERD (gastroesophageal reflux disease)   . Hypothyroidism   . Anxiety   . Arthritis   . PONV (postoperative nausea and vomiting)     Assessment/Plan: 1 Day Post-Op Procedure(s) (LRB): LEFT TOTAL KNEE ARTHROPLASTY (Left) Principal Problem:   OA (osteoarthritis) of knee Active Problems:   Hyponatremia   Postoperative anemia due to acute blood  loss  Estimated body mass index is 27.87 kg/(m^2) as calculated from the following:   Height as of this encounter: 5\' 6"  (1.676 m).   Weight as of this encounter: 78.297 kg (172 lb 9.8 oz). Advance diet Up with therapy Plan for discharge tomorrow Discharge home with home health  DVT Prophylaxis - Xarelto Weight-Bearing as tolerated to left leg D/C O2 and Pulse OX and try on Room Air  Patrica DuelERKINS, Karey Suthers 11/03/2013, 8:31 AM

## 2013-11-03 NOTE — Discharge Instructions (Addendum)
° °Dr. Frank Aluisio °Total Joint Specialist °[facility] °[address] °[place], [place] [address] °[phone] ° °TOTAL KNEE REPLACEMENT POSTOPERATIVE DIRECTIONS ° ° ° °Knee Rehabilitation, Guidelines Following Surgery  °Results after knee surgery are often greatly improved when you follow the exercise, range of motion and muscle strengthening exercises prescribed by your doctor. Safety measures are also important to protect the knee from further injury. Any time any of these exercises cause you to have increased pain or swelling in your knee joint, decrease the amount until you are comfortable again and slowly increase them. If you have problems or questions, call your caregiver or physical therapist for advice.  ° °HOME CARE INSTRUCTIONS  °Remove items at home which could result in a fall. This includes throw rugs or furniture in walking pathways.  °Continue medications as instructed at time of discharge. °You may have some home medications which will be placed on hold until you complete the course of blood thinner medication.  °You may start showering once you are discharged home but do not submerge the incision under water. Just pat the incision dry and apply a dry gauze dressing on daily. °Walk with walker as instructed.  °You may resume a sexual relationship in one month or when given the OK by  your doctor.  °· Use walker as long as suggested by your caregivers. °· Avoid periods of inactivity such as sitting longer than an hour when not asleep. This helps prevent blood clots.  °You may put full weight on your legs and walk as much as is comfortable.  °You may return to work once you are cleared by your doctor.  °Do not drive a car for 6 weeks or until released by you surgeon.  °· Do not drive while taking narcotics.  °Wear the elastic stockings for three weeks following surgery during the day but you may remove then at night. °Make sure you keep all of your appointments after your  operation with all of your doctors and caregivers. You should call the office at the above phone number and make an appointment for approximately two weeks after the date of your surgery. °Change the dressing daily and reapply a dry dressing each time. °Please pick up a stool softener and laxative for home use as long as you are requiring pain medications. °· Continue to use ice on the knee for pain and swelling from surgery. You may notice swelling that will progress down to the foot and ankle.  This is normal after surgery.  Elevate the leg when you are not up walking on it.   °It is important for you to complete the blood thinner medication as prescribed by your doctor. °· Continue to use the breathing machine which will help keep your temperature down.  It is common for your temperature to cycle up and down following surgery, especially at night when you are not up moving around and exerting yourself.  The breathing machine keeps your lungs expanded and your temperature down. ° °RANGE OF MOTION AND STRENGTHENING EXERCISES  °Rehabilitation of the knee is important following a knee injury or an operation. After just a few days of immobilization, the muscles of the thigh which control the knee become weakened and shrink (atrophy). Knee exercises are designed to build up the tone and strength of the thigh muscles and to improve knee motion. Often times heat used for twenty to thirty minutes before working out will loosen up your tissues and help with improving the   range of motion but do not use heat for the first two weeks following surgery. These exercises can be done on a training (exercise) mat, on the floor, on a table or on a bed. Use what ever works the best and is most comfortable for you Knee exercises include:  Leg Lifts - While your knee is still immobilized in a splint or cast, you can do straight leg raises. Lift the leg to 60 degrees, hold for 3 sec, and slowly lower the leg. Repeat 10-20 times 2-3  times daily. Perform this exercise against resistance later as your knee gets better.  Quad and Hamstring Sets - Tighten up the muscle on the front of the thigh (Quad) and hold for 5-10 sec. Repeat this 10-20 times hourly. Hamstring sets are done by pushing the foot backward against an object and holding for 5-10 sec. Repeat as with quad sets.  A rehabilitation program following serious knee injuries can speed recovery and prevent re-injury in the future due to weakened muscles. Contact your doctor or a physical therapist for more information on knee rehabilitation.   SKILLED REHAB INSTRUCTIONS: If the patient is transferred to a skilled rehab facility following release from the hospital, a list of the current medications will be sent to the facility for the patient to continue.  When discharged from the skilled rehab facility, please have the facility set up the patient's Home Health Physical Therapy prior to being released. Also, the skilled facility will be responsible for providing the patient with their medications at time of release from the facility to include their pain medication, the muscle relaxants, and their blood thinner medication. If the patient is still at the rehab facility at time of the two week follow up appointment, the skilled rehab facility will also need to assist the patient in arranging follow up appointment in our office and any transportation needs.  MAKE SURE YOU:  Understand these instructions.  Will watch your condition.  Will get help right away if you are not doing well or get worse.    Pick up stool softner and laxative for home. Do not submerge incision under water. May shower. Continue to use ice for pain and swelling from surgery.  Take Xarelto for two and a half more weeks, then discontinue Xarelto. Once the patient has completed the blood thinner regimen, then take a Baby 81 mg Aspirin daily for four more weeks.   Information on my medicine - XARELTO  (Rivaroxaban)  This medication education was reviewed with me or my healthcare representative as part of my discharge preparation.  The pharmacist that spoke with me during my hospital stay was:  Wynonia HazardSumme, Colleen E, [facility]  Why was Xarelto prescribed for you? Xarelto was prescribed for you to reduce the risk of blood clots forming after orthopedic surgery. The medical term for these abnormal blood clots is venous thromboembolism (VTE).  What do you need to know about xarelto ? Take your Xarelto ONCE DAILY at the same time every day. You may take it either with or without food.  If you have difficulty swallowing the tablet whole, you may crush it and mix in applesauce just prior to taking your dose.  Take Xarelto exactly as prescribed by your doctor and DO NOT stop taking Xarelto without talking to the doctor who prescribed the medication.  Stopping without other VTE prevention medication to take the place of Xarelto may increase your risk of developing a clot.  After discharge, you should have regular check-up  discharge, you should have regular check-up appointments with your healthcare provider that is prescribing your Xarelto®.   ° °What do you do if you miss a dose? °If you miss a dose, take it as soon as you remember on the same day then continue your regularly scheduled once daily regimen the next day. Do not take two doses of Xarelto® on the same day.  ° °Important Safety Information °A possible side effect of Xarelto® is bleeding. You should call your healthcare provider right away if you experience any of the following: °  Bleeding from an injury or your nose that does not stop. °  Unusual colored urine (red or dark brown) or unusual colored stools (red or black). °  Unusual bruising for unknown reasons. °  A serious fall or if you hit your head (even if there is no bleeding). ° °Some medicines may interact with Xarelto® and might increase your risk of bleeding while on Xarelto®. To help avoid this, consult your healthcare  provider or pharmacist prior to using any new prescription or non-prescription medications, including herbals, vitamins, non-steroidal anti-inflammatory drugs (NSAIDs) and supplements. ° °This website has more information on Xarelto®: www.xarelto.com. ° ° ° °

## 2013-11-03 NOTE — Progress Notes (Signed)
KVO fluids per previous MD order. Pt tolerating diet and liquids well.

## 2013-11-03 NOTE — Progress Notes (Signed)
Physical Therapy Treatment Note   11/03/13 1500  PT Visit Information  Last PT Received On 11/03/13  Assistance Needed +1  History of Present Illness s/p L TKR  PT Time Calculation  PT Start Time 1422  PT Stop Time 1431  PT Time Calculation (min) 9 min  Subjective Data  Subjective Pt frustrated by nausea however states she has had meds and agreeable to ambulate as tolerated.  Precautions  Precautions Knee  Required Braces or Orthoses Knee Immobilizer - Left  Knee Immobilizer - Left Discontinue once straight leg raise with < 10 degree lag  Restrictions  Other Position/Activity Restrictions WBAT  Cognition  Arousal/Alertness Awake/alert  Behavior During Therapy WFL for tasks assessed/performed  Overall Cognitive Status Within Functional Limits for tasks assessed  Bed Mobility  Overal bed mobility Needs Assistance  Bed Mobility Supine to Sit;Sit to Supine  Supine to sit Supervision  Sit to supine Supervision  General bed mobility comments no assist required however supervision just for nausea/pain  Transfers  Overall transfer level Needs assistance  Equipment used Rolling walker (2 wheeled)  Transfers Sit to/from Stand  Sit to Stand Min guard  General transfer comment verbal cues for safe technique  Ambulation/Gait  Ambulation/Gait assistance Min guard  Ambulation Distance (Feet) 140 Feet  Assistive device Rolling walker (2 wheeled)  Gait Pattern/deviations Step-through pattern;Antalgic  General Gait Details verbal cues for posture, RW distance, step length, sequence  PT - End of Session  Activity Tolerance Patient tolerated treatment well  Patient left in bed;with call bell/phone within reach  PT - Assessment/Plan  PT Plan Current plan remains appropriate  PT Frequency 7X/week  Follow Up Recommendations Home health PT  PT equipment None recommended by PT  PT Goal Progression  Progress towards PT goals Progressing toward goals  PT General Charges  $$ ACUTE PT VISIT 1  Procedure  PT Treatments  $Gait Training 8-22 mins   Zenovia JarredKati Imani Sherrin, PT, DPT 11/03/2013 Pager: [phone]

## 2013-11-03 NOTE — Evaluation (Signed)
Occupational Therapy Evaluation Patient Details Name: Cathy Fitzpatrick MRN: [id number] DOB: 08/23/1946 Today's Date: 11/03/2013 Time: [id number] OT Time Calculation (min): 17 min  OT Assessment / Plan / Recommendation History of present illness s/p L TKR   Clinical Impression   Pt was admitted for the above surgery.  All education was completed.  Pt does not need any further OT at this time.    OT Assessment  Patient does not need any further OT services    Follow Up Recommendations  No OT follow up    Barriers to Discharge      Equipment Recommendations  None recommended by OT    Recommendations for Other Services    Frequency       Precautions / Restrictions Precautions Precautions: Knee Precaution Comments: pt able to perform SLR Required Braces or Orthoses: Knee Immobilizer - Left Knee Immobilizer - Left: Discontinue once straight leg raise with < 10 degree lag Restrictions Other Position/Activity Restrictions: WBAT   Pertinent Vitals/Pain L knee sore; repositioned and ice applied    ADL  Grooming: Supervision/safety Where Assessed - Grooming: Supported standing Upper Body Bathing: Set up Where Assessed - Upper Body Bathing: Unsupported sitting Lower Body Bathing: Minimal assistance Where Assessed - Lower Body Bathing: Supported sit to stand Upper Body Dressing: Minimal assistance (iv) Where Assessed - Upper Body Dressing: Supported sit to stand Lower Body Dressing: Minimal assistance Where Assessed - Lower Body Dressing: Supported sit to Pharmacist, hospitalstand Toilet Transfer: Counsellorimulated;Min guard Toilet Transfer Method: Sit to stand Toileting - ArchitectClothing Manipulation and Hygiene: Min guard Where Assessed - Engineer, miningToileting Clothing Manipulation and Hygiene: Sit to stand from 3-in-1 or toilet Transfers/Ambulation Related to ADLs: min guard for safety.  Pt initially started to stand without walker:  cued to wait for safety.  She is moving LLE well and feels confident.  Very motivated ADL  Comments: Recommended long net on stick for LB  bathing; husband will assist with socks.  Pt does not feel like she needs to practice shower.  She has a very small ledge    OT Diagnosis:    OT Problem List:   OT Treatment Interventions:     OT Goals(Current goals can be found in the care plan section)    Visit Information  Last OT Received On: 11/03/13 Assistance Needed: +1 History of Present Illness: s/p L TKR       Prior Functioning     Home Living Family/patient expects to be discharged to:: Private residence Living Arrangements: Spouse/significant other Type of Home: House Home Access: Stairs to enter Secretary/administratorntrance Stairs-Number of Steps: 3 Entrance Stairs-Rails: Can reach both Home Layout: One level Home Equipment: Bedside commode;Shower seat;Shower seat - built in Additional Comments: borrowing 3:1.  Shower has 1-2" ledge Prior Function Level of Independence: Independent Communication Communication: No difficulties         Vision/Perception     Cognition  Cognition Arousal/Alertness: Awake/alert Behavior During Therapy: WFL for tasks assessed/performed Overall Cognitive Status: Within Functional Limits for tasks assessed    Extremity/Trunk Assessment Upper Extremity Assessment Upper Extremity Assessment: Overall WFL for tasks assessed Lower Extremity Assessment Lower Extremity Assessment: LLE deficits/detail LLE Deficits / Details: L knee AROM -5-75 degrees, good quad contraction, able to perform SLR     Mobility Transfers Overall transfer level: Needs assistance Equipment used: Rolling walker (2 wheeled) Transfers: Sit to/from Stand Sit to Stand: Min guard;Min assist General transfer comment: min A for sitting to control descent     Exercise  Balance     End of Session OT - End of Session Activity Tolerance: Patient tolerated treatment well Patient left: in chair;with call bell/phone within reach;with family/visitor present CPM Left Knee CPM  Left Knee: Off  GO     Ivor Kishi 11/03/2013, 10:27 AM Marica Otter, OTR/L [phone] 11/03/2013

## 2013-11-03 NOTE — Progress Notes (Signed)
Utilization review completed.  

## 2013-11-03 NOTE — Evaluation (Signed)
Physical Therapy Evaluation Patient Details Name: Cathy HaywardSusan Fitzpatrick MRN: [id number] DOB: 06/18/1946 Today's Date: 11/03/2013 Time: [id number] PT Time Calculation (min): 25 min  PT Assessment / Plan / Recommendation History of Present Illness  s/p L TKR  Clinical Impression  Pt is s/p L TKA resulting in the deficits listed below (see PT Problem List).  Pt will benefit from skilled PT to increase their independence and safety with mobility to allow discharge to the venue listed below.  Pt mobilizing well POD #1 however did become dizzy and nauseated during ambulation however better once resting in recliner.  Pt also performed exercises.      PT Assessment  Patient needs continued PT services    Follow Up Recommendations  Home health PT    Does the patient have the potential to tolerate intense rehabilitation      Barriers to Discharge        Equipment Recommendations  None recommended by PT    Recommendations for Other Services     Frequency 7X/week    Precautions / Restrictions Precautions Precautions: Knee Precaution Comments: pt able to perform SLR Required Braces or Orthoses: Knee Immobilizer - Left Knee Immobilizer - Left: Discontinue once straight leg raise with < 10 degree lag Restrictions Other Position/Activity Restrictions: WBAT   Pertinent Vitals/Pain No pain at rest, premedicated, exercises to tolerance, ice packs applied      Mobility  Bed Mobility Overal bed mobility: Needs Assistance Bed Mobility: Supine to Sit Supine to sit: Supervision General bed mobility comments: verbal cues for self assist Transfers Overall transfer level: Needs assistance Equipment used: Rolling walker (2 wheeled) Transfers: Sit to/from Stand Sit to Stand: Min guard;Min assist General transfer comment: verbal cues for safe technique, assist to control descent to recliner due to dizziness/nausea after ambulation Ambulation/Gait Ambulation/Gait assistance: Min assist Ambulation  Distance (Feet): 60 Feet Assistive device: Rolling walker (2 wheeled) Gait Pattern/deviations: Step-through pattern;Trunk flexed;Antalgic General Gait Details: verbal cues for posture, RW distance, step length, sequence    Exercises Total Joint Exercises Ankle Circles/Pumps: AROM;Both;15 reps Quad Sets: AROM;Both;15 reps Short Arc Quad: AROM;Left;15 reps Heel Slides: AAROM;Supine;15 reps;Left Hip ABduction/ADduction: AROM;Left;15 reps Straight Leg Raises: AROM;Left;10 reps   PT Diagnosis: Difficulty walking;Acute pain  PT Problem List: Decreased strength;Decreased range of motion;Decreased mobility;Decreased knowledge of use of DME;Decreased knowledge of precautions;Pain PT Treatment Interventions: Gait training;DME instruction;Stair training;Functional mobility training;Therapeutic exercise;Therapeutic activities;Patient/family education     PT Goals(Current goals can be found in the care plan section) Acute Rehab PT Goals PT Goal Formulation: With patient Time For Goal Achievement: 11/07/13 Potential to Achieve Goals: Good  Visit Information  Last PT Received On: 11/03/13 Assistance Needed: +1 History of Present Illness: s/p L TKR       Prior Functioning  Home Living Family/patient expects to be discharged to:: Private residence Living Arrangements: Spouse/significant other Type of Home: House Home Access: Stairs to enter Secretary/administratorntrance Stairs-Number of Steps: 3 Entrance Stairs-Rails: Can reach both Home Layout: One level Home Equipment: Bedside commode;Shower seat;Shower seat - built in Additional Comments: borrowing 3:1.  Shower has 1-2" ledge Prior Function Level of Independence: Independent Communication Communication: No difficulties    Cognition  Cognition Arousal/Alertness: Awake/alert Behavior During Therapy: WFL for tasks assessed/performed Overall Cognitive Status: Within Functional Limits for tasks assessed    Extremity/Trunk Assessment Upper Extremity  Assessment Upper Extremity Assessment: Overall WFL for tasks assessed Lower Extremity Assessment Lower Extremity Assessment: LLE deficits/detail LLE Deficits / Details: L knee AROM -5-75 degrees, good quad contraction, able  to perform SLR   Balance    End of Session PT - End of Session Activity Tolerance: Other (comment) (limited by nausea/dizziness) Patient left: in chair;with call bell/phone within reach CPM Left Knee CPM Left Knee: Off  GP     Cathy Fitzpatrick,Cathy Fitzpatrick 11/03/2013, 10:37 AM Cathy Fitzpatrick, PT, DPT 11/03/2013 Pager: [phone]

## 2013-11-04 LAB — BASIC METABOLIC PANEL
BUN: 14 mg/dL (ref 6–23)
CALCIUM: 8.9 mg/dL (ref 8.4–10.5)
CO2: 28 meq/L (ref 19–32)
Chloride: 99 mEq/L (ref 96–112)
Creatinine, Ser: 0.67 mg/dL (ref 0.50–1.10)
GFR calc Af Amer: >90 mL/min (ref >90)
GFR calc non Af Amer: 88 mL/min — ABNORMAL LOW (ref >90)
GLUCOSE: 108 mg/dL — AB (ref 70–99)
Potassium: 4.2 mEq/L (ref 3.7–5.3)
SODIUM: 137 meq/L (ref 137–147)

## 2013-11-04 LAB — CBC
HEMATOCRIT: 28.5 % — AB (ref 36.0–46.0)
HEMOGLOBIN: 9.4 g/dL — AB (ref 12.0–15.0)
MCH: 29.7 pg (ref 26.0–34.0)
MCHC: 33 g/dL (ref 30.0–36.0)
MCV: 90.2 fL (ref 78.0–100.0)
Platelets: 188 10*3/uL (ref 150–400)
RBC: 3.16 MIL/uL — AB (ref 3.87–5.11)
RDW: 12.8 % (ref 11.5–15.5)
WBC: 8.5 10*3/uL (ref 4.0–10.5)

## 2013-11-04 MED ORDER — HYDROCODONE-ACETAMINOPHEN 5-325 MG PO TABS: 1.0000 | ORAL_TABLET | ORAL | Status: DC | PRN

## 2013-11-04 MED ORDER — OXYCODONE HCL 5 MG PO TABS: 5.0000 mg | ORAL_TABLET | ORAL | Status: DC | PRN

## 2013-11-04 MED ORDER — METHOCARBAMOL 500 MG PO TABS: 500.0000 mg | ORAL_TABLET | Freq: Four times a day (QID) | ORAL | Status: AC | PRN

## 2013-11-04 MED ORDER — RIVAROXABAN 10 MG PO TABS: 10.0000 mg | ORAL_TABLET | Freq: Every day | ORAL | Status: AC

## 2013-11-04 NOTE — Progress Notes (Signed)
Physical Therapy Treatment Patient Details Name: Cathy HaywardSusan Fitzpatrick MRN: [id number] DOB: 02/15/1946 Today's Date: 11/04/2013 Time: [id number] PT Time Calculation (min): 24 min  PT Assessment / Plan / Recommendation  History of Present Illness s/p L TKR   PT Comments   Pt mobilizing well today and reports nausea much better.  Pt ambulated good distance in hallway and practiced stairs.  Pt also performed LE exercises and provided with HEP handout.  Pt had no questions/concerns and ready to d/c home today.  Follow Up Recommendations  Home health PT     Does the patient have the potential to tolerate intense rehabilitation     Barriers to Discharge        Equipment Recommendations  None recommended by PT    Recommendations for Other Services    Frequency 7X/week   Progress towards PT Goals Progress towards PT goals: Progressing toward goals  Plan Current plan remains appropriate    Precautions / Restrictions Precautions Precautions: Knee Precaution Comments: pt able to perform SLR Restrictions LLE Weight Bearing: Weight bearing as tolerated   Pertinent Vitals/Pain Premedicated, ice packs applied    Mobility  Transfers Overall transfer level: Modified independent Equipment used: Rolling walker (2 wheeled) Transfers: Sit to/from Stand General transfer comment: pt in bathroom getting up from toilet mod I upon arrival to room Ambulation/Gait Ambulation/Gait assistance: Supervision Ambulation Distance (Feet): 240 Feet Assistive device: Rolling walker (2 wheeled) Gait Pattern/deviations: Step-through pattern;Antalgic General Gait Details: verbal cues for RW distance, step length Stairs: Yes Stairs assistance: Min guard Stair Management: Step to pattern;Forwards;One rail Left Number of Stairs: 4 General stair comments: pt educated on sequence and safety as well as spouse, pt had PT provide HHA however pt did not put weight through (mostly used rail side)    Exercises Total Joint  Exercises Ankle Circles/Pumps: AROM;Both;15 reps Quad Sets: AROM;Both;15 reps Short Arc Quad: AROM;Left;15 reps Heel Slides: AAROM;Supine;15 reps;Left;Seated Hip ABduction/ADduction: AROM;Left;15 reps Straight Leg Raises: AROM;Left;15 reps Goniometric ROM: at least 90* knee flexion observed with exercises   PT Diagnosis:    PT Problem List:   PT Treatment Interventions:     PT Goals (current goals can now be found in the care plan section)    Visit Information  Last PT Received On: 11/04/13 Assistance Needed: +1 History of Present Illness: s/p L TKR    Subjective Data      Cognition  Cognition Arousal/Alertness: Awake/alert Behavior During Therapy: WFL for tasks assessed/performed Overall Cognitive Status: Within Functional Limits for tasks assessed    Balance     End of Session PT - End of Session Activity Tolerance: Patient tolerated treatment well Patient left: with call bell/phone within reach;in chair;with family/visitor present   GP     Yolander Goodie,KATHrine E 11/04/2013, 12:02 PM Zenovia JarredKati Qualyn Oyervides, PT, DPT 11/04/2013 Pager: [phone]

## 2013-11-04 NOTE — Care Management Note (Signed)
    Page 1 of 1   11/04/2013     2:19:40 PM   CARE MANAGEMENT NOTE 11/04/2013  Patient:  Cathy Fitzpatrick,Cathy Fitzpatrick   Account Number:  [id number]  Date Initiated:  11/03/2013  Documentation initiated by:  Colleen CanMANNING,Aureliano Oshields  Subjective/Objective Assessment:   dx left knee replacemnt     Action/Plan:   CM spoke with patient. Plans are [place] for her. She already has DME. Wants [facility] for HHpt services.   Anticipated DC Date:  11/04/2013   Anticipated DC Plan:  HOME W HOME HEALTH SERVICES      DC Planning Services  CM consult      [facility]   Choice offered to / List presented to:  C-1 Patient        HH arranged  HH-2 PT      [facility]   Status of service:  Completed, signed off Medicare Important Message given?  NA - LOS <3 / Initial given by admissions (If response is "NO", the following Medicare IM given date fields will be blank) Date Medicare IM given:   Date Additional Medicare IM given:    Discharge Disposition:  HOME W HOME HEALTH SERVICES  Per UR Regulation:    If discussed at Long Length of Stay Meetings, dates discussed:    Comments:  11/04/2013 Colleen CanLinda Adarius Fitzpatrick BSN RN CCM [phone] [facility] with start of day after discharge.

## 2013-11-04 NOTE — Progress Notes (Signed)
   Subjective: 16 Days Post-Op Procedure(s) (LRB): LEFT TOTAL KNEE ARTHROPLASTY (Left) Patient reports pain as mild.   Patient seen in rounds with Dr. Lequita HaltAluisio. Patient is well, and has had no acute complaints or problems Patient is ready to go home  Objective: Vital signs in last 24 hours:    Intake/Output from previous day: No intake or output data in the 24 hours ending 11/18/13 2056  Intake/Output this shift:    Labs: No results found for this basename: HGB,  in the last 72 hours No results found for this basename: WBC, RBC, HCT, PLT,  in the last 72 hours No results found for this basename: NA, K, CL, CO2, BUN, CREATININE, GLUCOSE, CALCIUM,  in the last 72 hours No results found for this basename: LABPT, INR,  in the last 72 hours  EXAM: General - Patient is Alert, Appropriate and Oriented Extremity - Neurovascular intact Sensation intact distally Incision - clean, no drainage Motor Function - intact, moving foot and toes well on exam.   Assessment/Plan: 16 Days Post-Op Procedure(s) (LRB): LEFT TOTAL KNEE ARTHROPLASTY (Left) Procedure(s) (LRB): LEFT TOTAL KNEE ARTHROPLASTY (Left) Past Medical History  Diagnosis Date  . Macular degeneration, dry     right side, slight  . GERD (gastroesophageal reflux disease)   . Hypothyroidism   . Anxiety   . Arthritis   . PONV (postoperative nausea and vomiting)    Principal Problem:   OA (osteoarthritis) of knee Active Problems:   Hyponatremia   Postoperative anemia due to acute blood loss  Estimated body mass index is 27.87 kg/(m^2) as calculated from the following:   Height as of this encounter: 5\' 6"  (1.676 m).   Weight as of this encounter: 78.297 kg (172 lb 9.8 oz). Up with therapy Discharge home with home health Diet - Regular diet Follow up - in 2 weeks Activity - WBAT Disposition - Home Condition Upon Discharge - Good D/C Meds - See DC Summary DVT Prophylaxis - Xarelto  Brantlee Penn 11/18/2013,  8:56 PM

## 2013-11-04 NOTE — Discharge Summary (Signed)
Physician Discharge Summary   Patient ID: Cathy Fitzpatrick MRN: [id number] DOB/AGE: 69-Aug-1947 69 y.o.  Admit date: 11/02/2013 Discharge date: 11/04/2013  Primary Diagnosis:  Osteoarthritis Left knee(s)  Admission Diagnoses:  Past Medical History  Diagnosis Date  . Macular degeneration, dry     right side, slight  . GERD (gastroesophageal reflux disease)   . Hypothyroidism   . Anxiety   . Arthritis   . PONV (postoperative nausea and vomiting)    Discharge Diagnoses:   Principal Problem:   OA (osteoarthritis) of knee Active Problems:   Hyponatremia   Postoperative anemia due to acute blood loss  Estimated body mass index is 27.87 kg/(m^2) as calculated from the following:   Height as of this encounter: 5' 6"  (1.676 m).   Weight as of this encounter: 78.297 kg (172 lb 9.8 oz).  Procedure:  Procedure(s) (LRB): LEFT TOTAL KNEE ARTHROPLASTY (Left)   Consults: None  HPI: Cathy Fitzpatrick is a 68 y.o. year old female with end stage OA of her left knee with progressively worsening pain and dysfunction. She has constant pain, with activity and at rest and significant functional deficits with difficulties even with ADLs. She has had extensive non-op management including analgesics, injections of cortisone and viscosupplements, and home exercise program, but remains in significant pain with significant dysfunction. Radiographs show bone on bone arthritis all 3 compartments. She presents now for left Total Knee Arthroplasty.   Laboratory Data: Admission on 11/02/2013, Discharged on 11/04/2013  Component Date Value Ref Range Status  . WBC 11/03/2013 8.9  4.0 - 10.5 K/uL Final  . RBC 11/03/2013 3.56* 3.87 - 5.11 MIL/uL Final  . Hemoglobin 11/03/2013 10.5* 12.0 - 15.0 g/dL Final  . HCT 11/03/2013 32.2* 36.0 - 46.0 % Final  . MCV 11/03/2013 90.4  78.0 - 100.0 fL Final  . MCH 11/03/2013 29.5  26.0 - 34.0 pg Final  . MCHC 11/03/2013 32.6  30.0 - 36.0 g/dL Final  . RDW 11/03/2013 12.7  11.5 -  15.5 % Final  . Platelets 11/03/2013 216  150 - 400 K/uL Final  . Sodium 11/03/2013 134* 137 - 147 mEq/L Final  . Potassium 11/03/2013 4.3  3.7 - 5.3 mEq/L Final  . Chloride 11/03/2013 98  96 - 112 mEq/L Final  . CO2 11/03/2013 26  19 - 32 mEq/L Final  . Glucose, Bld 11/03/2013 155* 70 - 99 mg/dL Final  . BUN 11/03/2013 12  6 - 23 mg/dL Final  . Creatinine, Ser 11/03/2013 0.60  0.50 - 1.10 mg/dL Final  . Calcium 11/03/2013 8.6  8.4 - 10.5 mg/dL Final  . GFR calc non Af Amer 11/03/2013 >90  >90 mL/min Final  . GFR calc Af Amer 11/03/2013 >90  >90 mL/min Final   Comment: (NOTE)                          The eGFR has been calculated using the CKD EPI equation.                          This calculation has not been validated in all clinical situations.                          eGFR's persistently <90 mL/min signify possible Chronic Kidney  Disease.  . WBC 11/04/2013 8.5  4.0 - 10.5 K/uL Final  . RBC 11/04/2013 3.16* 3.87 - 5.11 MIL/uL Final  . Hemoglobin 11/04/2013 9.4* 12.0 - 15.0 g/dL Final  . HCT 11/04/2013 28.5* 36.0 - 46.0 % Final  . MCV 11/04/2013 90.2  78.0 - 100.0 fL Final  . MCH 11/04/2013 29.7  26.0 - 34.0 pg Final  . MCHC 11/04/2013 33.0  30.0 - 36.0 g/dL Final  . RDW 11/04/2013 12.8  11.5 - 15.5 % Final  . Platelets 11/04/2013 188  150 - 400 K/uL Final  . Sodium 11/04/2013 137  137 - 147 mEq/L Final  . Potassium 11/04/2013 4.2  3.7 - 5.3 mEq/L Final  . Chloride 11/04/2013 99  96 - 112 mEq/L Final  . CO2 11/04/2013 28  19 - 32 mEq/L Final  . Glucose, Bld 11/04/2013 108* 70 - 99 mg/dL Final  . BUN 11/04/2013 14  6 - 23 mg/dL Final  . Creatinine, Ser 11/04/2013 0.67  0.50 - 1.10 mg/dL Final  . Calcium 11/04/2013 8.9  8.4 - 10.5 mg/dL Final  . GFR calc non Af Amer 11/04/2013 88* >90 mL/min Final  . GFR calc Af Amer 11/04/2013 >90  >90 mL/min Final   Comment: (NOTE)                          The eGFR has been calculated using the CKD EPI equation.                           This calculation has not been validated in all clinical situations.                          eGFR's persistently <90 mL/min signify possible Chronic Kidney                          Disease.  Hospital Outpatient Visit on 10/26/2013  Component Date Value Ref Range Status  . MRSA, PCR 10/26/2013 NEGATIVE  NEGATIVE Final  . Staphylococcus aureus 10/26/2013 NEGATIVE  NEGATIVE Final   Comment:                                 The Xpert SA Assay (FDA                          approved for NASAL specimens                          in patients over 56 years of age),                          is one component of                          a comprehensive surveillance                          program.  Test performance has                          been validated by [employer]  [facility] for patients greater                          than or equal to 10 year old.                          It is not intended                          to diagnose infection nor to                          guide or monitor treatment.  Marland Kitchen aPTT 10/26/2013 26  24 - 37 seconds Final  . WBC 10/26/2013 5.8  4.0 - 10.5 K/uL Final  . RBC 10/26/2013 4.24  3.87 - 5.11 MIL/uL Final  . Hemoglobin 10/26/2013 12.6  12.0 - 15.0 g/dL Final  . HCT 10/26/2013 38.3  36.0 - 46.0 % Final  . MCV 10/26/2013 90.3  78.0 - 100.0 fL Final  . MCH 10/26/2013 29.7  26.0 - 34.0 pg Final  . MCHC 10/26/2013 32.9  30.0 - 36.0 g/dL Final  . RDW 10/26/2013 13.0  11.5 - 15.5 % Final  . Platelets 10/26/2013 247  150 - 400 K/uL Final  . Sodium 10/26/2013 142  137 - 147 mEq/L Final  . Potassium 10/26/2013 4.2  3.7 - 5.3 mEq/L Final  . Chloride 10/26/2013 103  96 - 112 mEq/L Final  . CO2 10/26/2013 28  19 - 32 mEq/L Final  . Glucose, Bld 10/26/2013 91  70 - 99 mg/dL Final  . BUN 10/26/2013 11  6 - 23 mg/dL Final  . Creatinine, Ser 10/26/2013 0.65  0.50 - 1.10 mg/dL Final  . Calcium 10/26/2013 9.3  8.4 - 10.5 mg/dL Final  . Total  Protein 10/26/2013 6.9  6.0 - 8.3 g/dL Final  . Albumin 10/26/2013 3.9  3.5 - 5.2 g/dL Final  . AST 10/26/2013 26  0 - 37 U/L Final  . ALT 10/26/2013 21  0 - 35 U/L Final  . Alkaline Phosphatase 10/26/2013 82  39 - 117 U/L Final  . Total Bilirubin 10/26/2013 0.3  0.3 - 1.2 mg/dL Final  . GFR calc non Af Amer 10/26/2013 89* >90 mL/min Final  . GFR calc Af Amer 10/26/2013 >90  >90 mL/min Final   Comment: (NOTE)                          The eGFR has been calculated using the CKD EPI equation.                          This calculation has not been validated in all clinical situations.                          eGFR's persistently <90 mL/min signify possible Chronic Kidney                          Disease.  Marland Kitchen Prothrombin Time 10/26/2013 12.3  11.6 - 15.2 seconds Final  . INR 10/26/2013 0.93  0.00 - 1.49 Final  . ABO/RH(D) 10/26/2013 O POS   Final  . Antibody Screen 10/26/2013 NEG   Final  . Sample Expiration 10/26/2013 11/05/2013   Final  .  Color, Urine 10/26/2013 YELLOW  YELLOW Final  . APPearance 10/26/2013 CLEAR  CLEAR Final  . Specific Gravity, Urine 10/26/2013 1.009  1.005 - 1.030 Final  . pH 10/26/2013 7.5  5.0 - 8.0 Final  . Glucose, UA 10/26/2013 NEGATIVE  NEGATIVE mg/dL Final  . Hgb urine dipstick 10/26/2013 NEGATIVE  NEGATIVE Final  . Bilirubin Urine 10/26/2013 NEGATIVE  NEGATIVE Final  . Ketones, ur 10/26/2013 NEGATIVE  NEGATIVE mg/dL Final  . Protein, ur 10/26/2013 NEGATIVE  NEGATIVE mg/dL Final  . Urobilinogen, UA 10/26/2013 0.2  0.0 - 1.0 mg/dL Final  . Nitrite 10/26/2013 NEGATIVE  NEGATIVE Final  . Leukocytes, UA 10/26/2013 TRACE* NEGATIVE Final  . ABO/RH(D) 10/26/2013 O POS   Final  . Squamous Epithelial / LPF 10/26/2013 FEW* RARE Final  . WBC, UA 10/26/2013 0-2  <3 WBC/hpf Final  . Urine-Other 10/26/2013 MUCOUS PRESENT   Final     X-Rays:No results found.  EKG:No orders found for this or any previous visit.   Hospital Course: Cathy Fitzpatrick is a 69 y.o. who was  admitted to [facility]. They were brought to the operating room on 11/02/2013 and underwent Procedure(s): LEFT TOTAL KNEE ARTHROPLASTY.  Patient tolerated the procedure well and was later transferred to the recovery room and then to the orthopaedic floor for postoperative care.  They were given PO and IV analgesics for pain control following their surgery.  They were given 24 hours of postoperative antibiotics of  Anti-infectives   Start     Dose/Rate Route Frequency Ordered Stop   11/02/13 1900  ceFAZolin (ANCEF) IVPB 2 g/50 mL premix     2 g 100 mL/hr over 30 Minutes Intravenous Every 6 hours 11/02/13 1608 11/03/13 0148   11/02/13 0926  ceFAZolin (ANCEF) IVPB 2 g/50 mL premix     2 g 100 mL/hr over 30 Minutes Intravenous On call to O.R. 11/02/13 2778 11/02/13 1315     and started on DVT prophylaxis in the form of Xarelto.   PT and OT were ordered for total joint protocol.  Discharge planning consulted to help with postop disposition and equipment needs.  Patient had a decent night on the evening of surgery but did not get any sleep.  They started to get up OOB with therapy on day one. Hemovac drain was pulled without difficulty.  Continued to work with therapy into day two.  Dressing was changed on day two and the incision was healing well.   Patient was seen in rounds on day two and was ready to go home.   Discharge Medications: Prior to Admission medications   Medication Sig Start Date End Date Taking? Authorizing Provider  amitriptyline (ELAVIL) 25 MG tablet Take 25 mg by mouth at bedtime.     Yes Historical Provider, MD  FLUoxetine (PROZAC) 20 MG tablet Take 20 mg by mouth every morning.    Yes Historical Provider, MD  levothyroxine (SYNTHROID, LEVOTHROID) 25 MCG tablet Take 25 mcg by mouth daily before breakfast.    Yes Historical Provider, MD  methocarbamol (ROBAXIN) 500 MG tablet Take 1 tablet (500 mg total) by mouth every 6 (six) hours as needed for muscle spasms. 11/04/13    Alexzandrew Perkins, PA-C  oxyCODONE (OXY IR/ROXICODONE) 5 MG immediate release tablet Take 1-2 tablets (5-10 mg total) by mouth every 3 (three) hours as needed for moderate pain or severe pain. 11/04/13   Alexzandrew Dara Lords, PA-C  rivaroxaban (XARELTO) 10 MG TABS tablet Take 1 tablet (10 mg total) by mouth daily  with breakfast. Take Xarelto for two and a half more weeks, then discontinue Xarelto. Once the patient has completed the blood thinner regimen, then take a Baby 81 mg Aspirin daily for four more weeks. 11/04/13   Alexzandrew Dara Lords, PA-C   Discharge home with home health  Diet - Regular diet  Follow up - in 2 weeks  Activity - WBAT  Disposition - Home  Condition Upon Discharge - Good  D/C Meds - See DC Summary  DVT Prophylaxis - Xarelto      Discharge Orders   Future Appointments Provider Department Dept Phone   11/23/2013 8:45 AM Lum Babe, [place] [phone]   Future Orders Complete By Expires   Call MD / Call 911  As directed    Comments:     If you experience chest pain or shortness of breath, CALL 911 and be transported to the hospital emergency room.  If you develope a fever above 101 F, pus (white drainage) or increased drainage or redness at the wound, or calf pain, call your surgeon's office.   Change dressing  As directed    Comments:     Change dressing daily with sterile 4 x 4 inch gauze dressing and apply TED hose. Do not submerge the incision under water.   Constipation Prevention  As directed    Comments:     Drink plenty of fluids.  Prune juice may be helpful.  You may use a stool softener, such as Colace (over the counter) 100 mg twice a day.  Use MiraLax (over the counter) for constipation as needed.   Diet general  As directed    Discharge instructions  As directed    Comments:     Pick up stool softner and laxative for home. Do not submerge incision under water. May shower. Continue to use ice for pain and  swelling from surgery.  Take Xarelto for two and a half more weeks, then discontinue Xarelto. Once the patient has completed the blood thinner regimen, then take a Baby 81 mg Aspirin daily for four more weeks.   Do not put a pillow under the knee. Place it under the heel.  As directed    Do not sit on low chairs, stoools or toilet seats, as it may be difficult to get up from low surfaces  As directed    Driving restrictions  As directed    Comments:     No driving until released by the physician.   Increase activity slowly as tolerated  As directed    Lifting restrictions  As directed    Comments:     No lifting until released by the physician.   Patient may shower  As directed    Comments:     You may shower without a dressing once there is no drainage.  Do not wash over the wound.  If drainage remains, do not shower until drainage stops.   TED hose  As directed    Comments:     Use stockings (TED hose) for 3 weeks on both leg(s).  You may remove them at night for sleeping.   Weight bearing as tolerated  As directed    Questions:     Laterality:     Extremity:         Medication List    STOP taking these medications       naproxen sodium 220 MG tablet  Commonly known as:  ANAPROX  OCUVITE PO      TAKE these medications       amitriptyline 25 MG tablet  Commonly known as:  ELAVIL  Take 25 mg by mouth at bedtime.     FLUoxetine 20 MG tablet  Commonly known as:  PROZAC  Take 20 mg by mouth every morning.     HYDROcodone-acetaminophen 5-325 MG per tablet  Commonly known as:  NORCO/VICODIN  Take 1-2 tablets by mouth every 4 (four) hours as needed for moderate pain.     levothyroxine 25 MCG tablet  Commonly known as:  SYNTHROID, LEVOTHROID  Take 25 mcg by mouth daily before breakfast.     methocarbamol 500 MG tablet  Commonly known as:  ROBAXIN  Take 1 tablet (500 mg total) by mouth every 6 (six) hours as needed for muscle spasms.     oxyCODONE 5 MG immediate  release tablet  Commonly known as:  Oxy IR/ROXICODONE  Take 1-2 tablets (5-10 mg total) by mouth every 3 (three) hours as needed for moderate pain or severe pain.     rivaroxaban 10 MG Tabs tablet  Commonly known as:  XARELTO  - Take 1 tablet (10 mg total) by mouth daily with breakfast. Take Xarelto for two and a half more weeks, then discontinue Xarelto.  - Once the patient has completed the blood thinner regimen, then take a Baby 81 mg Aspirin daily for four more weeks.       Follow-up Information   Follow up with Gearlean Alf, MD. Schedule an appointment as soon as possible for a visit on 11/17/2013.   Specialty:  Orthopedic Surgery   Contact information:   [address] [place] [address] [phone]       Signed: Mickel Crow 11/18/2013, 8:58 PM

## 2013-11-23 ENCOUNTER — Ambulatory Visit: Payer: Medicare Other | Attending: Orthopedic Surgery | Admitting: Physical Therapy

## 2013-11-23 DIAGNOSIS — IMO0001 Reserved for inherently not codable concepts without codable children: Secondary | ICD-10-CM | POA: Insufficient documentation

## 2013-11-23 DIAGNOSIS — Z96659 Presence of unspecified artificial knee joint: Secondary | ICD-10-CM | POA: Insufficient documentation

## 2013-11-25 ENCOUNTER — Ambulatory Visit: Payer: Medicare Other | Admitting: Physical Therapy

## 2013-11-27 ENCOUNTER — Ambulatory Visit: Payer: Medicare Other | Admitting: Physical Therapy

## 2013-11-30 ENCOUNTER — Ambulatory Visit: Payer: Medicare Other | Admitting: Physical Therapy

## 2013-12-02 ENCOUNTER — Ambulatory Visit: Payer: Medicare Other | Admitting: Physical Therapy

## 2013-12-04 ENCOUNTER — Ambulatory Visit: Payer: Medicare Other | Admitting: Physical Therapy

## 2013-12-07 ENCOUNTER — Ambulatory Visit: Payer: Medicare Other | Admitting: Physical Therapy

## 2013-12-09 ENCOUNTER — Ambulatory Visit: Payer: Medicare Other | Admitting: Physical Therapy

## 2013-12-11 ENCOUNTER — Ambulatory Visit: Payer: Medicare Other | Admitting: Physical Therapy

## 2013-12-14 ENCOUNTER — Ambulatory Visit: Payer: Medicare Other | Admitting: Physical Therapy

## 2013-12-16 ENCOUNTER — Ambulatory Visit: Payer: Medicare Other | Admitting: Physical Therapy

## 2013-12-18 ENCOUNTER — Ambulatory Visit: Payer: Medicare Other | Admitting: Physical Therapy

## 2013-12-21 ENCOUNTER — Ambulatory Visit: Payer: Medicare Other | Admitting: Physical Therapy

## 2013-12-23 ENCOUNTER — Ambulatory Visit: Payer: Medicare Other | Admitting: Physical Therapy

## 2013-12-30 ENCOUNTER — Ambulatory Visit: Payer: Medicare Other | Attending: Orthopedic Surgery | Admitting: Physical Therapy

## 2013-12-30 DIAGNOSIS — E871 Hypo-osmolality and hyponatremia: Secondary | ICD-10-CM | POA: Insufficient documentation

## 2013-12-30 DIAGNOSIS — IMO0001 Reserved for inherently not codable concepts without codable children: Secondary | ICD-10-CM | POA: Insufficient documentation

## 2013-12-30 DIAGNOSIS — IMO0002 Reserved for concepts with insufficient information to code with codable children: Secondary | ICD-10-CM | POA: Insufficient documentation

## 2013-12-30 DIAGNOSIS — M1710 Unilateral primary osteoarthritis, unspecified knee: Secondary | ICD-10-CM | POA: Insufficient documentation

## 2013-12-30 DIAGNOSIS — D62 Acute posthemorrhagic anemia: Secondary | ICD-10-CM | POA: Insufficient documentation

## 2014-11-10 ENCOUNTER — Encounter (HOSPITAL_BASED_OUTPATIENT_CLINIC_OR_DEPARTMENT_OTHER): Payer: Self-pay | Admitting: Emergency Medicine

## 2014-11-10 ENCOUNTER — Emergency Department (HOSPITAL_BASED_OUTPATIENT_CLINIC_OR_DEPARTMENT_OTHER)
Admission: EM | Admit: 2014-11-10 | Discharge: 2014-11-10 | Disposition: A | Discharge status: Home / Self Care | Payer: Medicare Other | Attending: Emergency Medicine | Admitting: Emergency Medicine

## 2014-11-10 DIAGNOSIS — F419 Anxiety disorder, unspecified: Secondary | ICD-10-CM | POA: Insufficient documentation

## 2014-11-10 DIAGNOSIS — E039 Hypothyroidism, unspecified: Secondary | ICD-10-CM | POA: Insufficient documentation

## 2014-11-10 DIAGNOSIS — H6091 Unspecified otitis externa, right ear: Secondary | ICD-10-CM | POA: Diagnosis not present

## 2014-11-10 DIAGNOSIS — Z79899 Other long term (current) drug therapy: Secondary | ICD-10-CM | POA: Diagnosis not present

## 2014-11-10 DIAGNOSIS — Z8719 Personal history of other diseases of the digestive system: Secondary | ICD-10-CM | POA: Diagnosis not present

## 2014-11-10 DIAGNOSIS — H9201 Otalgia, right ear: Secondary | ICD-10-CM | POA: Diagnosis present

## 2014-11-10 MED ORDER — HYDROCODONE-ACETAMINOPHEN 5-325 MG PO TABS
2.0000 | ORAL_TABLET | Freq: Once | ORAL | Status: AC
  Administered 2014-11-10: 2 via ORAL
  Filled 2014-11-10: qty 2

## 2014-11-10 MED ORDER — NEOMYCIN-POLYMYXIN-HC 3.5-10000-1 OT SUSP: 4.0000 [drp] | Freq: Four times a day (QID) | OTIC | Status: AC

## 2014-11-10 NOTE — Discharge Instructions (Signed)

## 2014-11-10 NOTE — ED Notes (Signed)
Patient reports that she has pain to her right ear and right face with swelling. The patient reports that she was seen and treated at [facility] - the patient reports that she stil has the pain and numbness to her right side of her lips.

## 2014-11-10 NOTE — ED Provider Notes (Signed)
CSN: [id number]     Arrival date & time 11/10/14  [id number] History   First MD Initiated Contact with Patient 11/10/14 [phone]     Chief Complaint  Patient presents with  . Otalgia     (Consider location/radiation/quality/duration/timing/severity/associated sxs/prior Treatment) Patient is a 69 y.o. female presenting with ear pain. The history is provided by the patient.  Otalgia Location:  Right Behind ear:  No abnormality Quality:  Aching and shooting Severity:  Moderate Onset quality:  Gradual Timing:  Constant Progression:  Unchanged Chronicity:  New Context: not direct blow and not elevation change   Associated symptoms: no fever     Past Medical History  Diagnosis Date  . Macular degeneration, dry     right side, slight  . GERD (gastroesophageal reflux disease)   . Hypothyroidism   . Anxiety   . Arthritis   . PONV (postoperative nausea and vomiting)    Past Surgical History  Procedure Laterality Date  . Meniscus repair Left 17 years ago  . Knee arthroscopy Left 17 years ago    "cleaned it out and aligned patella"  . Tubal ligation  40 years ago  . Bunionectomy Bilateral 2014  . Total knee arthroplasty Left 11/02/2013    Procedure: LEFT TOTAL KNEE ARTHROPLASTY;  Surgeon: Loanne DrillingFrank V Aluisio, MD;  Location: [facility];  Service: Orthopedics;  Laterality: Left;   History reviewed. No pertinent family history. History  Substance Use Topics  . Smoking status: Never Smoker   . Smokeless tobacco: Never Used  . Alcohol Use: Yes     Comment: social   OB History    No data available     Review of Systems  Constitutional: Negative for fever and chills.  HENT: Positive for ear pain.   All other systems reviewed and are negative.     Allergies  Codeine  Home Medications   Prior to Admission medications   Medication Sig Start Date End Date Taking? Authorizing Provider  amitriptyline (ELAVIL) 25 MG tablet Take 25 mg by mouth at bedtime.      Historical Provider, MD   FLUoxetine (PROZAC) 20 MG tablet Take 20 mg by mouth every morning.     Historical Provider, MD  HYDROcodone-acetaminophen (NORCO/VICODIN) 5-325 MG per tablet Take 1-2 tablets by mouth every 4 (four) hours as needed for moderate pain. 11/04/13   Avel Peacerew Perkins, PA-C  levothyroxine (SYNTHROID, LEVOTHROID) 25 MCG tablet Take 25 mcg by mouth daily before breakfast.     Historical Provider, MD  methocarbamol (ROBAXIN) 500 MG tablet Take 1 tablet (500 mg total) by mouth every 6 (six) hours as needed for muscle spasms. 11/04/13   Avel Peacerew Perkins, PA-C  oxyCODONE (OXY IR/ROXICODONE) 5 MG immediate release tablet Take 1-2 tablets (5-10 mg total) by mouth every 3 (three) hours as needed for moderate pain or severe pain. 11/04/13   Avel Peacerew Perkins, PA-C  rivaroxaban (XARELTO) 10 MG TABS tablet Take 1 tablet (10 mg total) by mouth daily with breakfast. Take Xarelto for two and a half more weeks, then discontinue Xarelto. Once the patient has completed the blood thinner regimen, then take a Baby 81 mg Aspirin daily for four more weeks. 11/04/13   Avel Peacerew Perkins, PA-C   BP 133/59 mmHg  Pulse 96  Temp(Src) 97.6 F (36.4 C) (Oral)  Resp 18  Wt 163 lb (73.936 kg)  SpO2 97% Physical Exam  Constitutional: She is oriented to person, place, and time. She appears well-developed and well-nourished. No distress.  HENT:  Head: Normocephalic  and atraumatic.  Mouth/Throat: Oropharynx is clear and moist.  Mild R sided facial swelling No mastoid tenderness or redness. No intraoral abscess.  Eyes: EOM are normal. Pupils are equal, round, and reactive to light.  Neck: Normal range of motion. Neck supple.  Cardiovascular: Normal rate and regular rhythm.  Exam reveals no friction rub.   No murmur heard. Pulmonary/Chest: Effort normal and breath sounds normal. No respiratory distress. She has no wheezes. She has no rales.  Abdominal: Soft. She exhibits no distension. There is no tenderness. There is no rebound.  Musculoskeletal:  Normal range of motion. She exhibits no edema.  Neurological: She is alert and oriented to person, place, and time. No cranial nerve deficit (Facial muscle and sensation intact).  Skin: She is not diaphoretic.  Nursing note and vitals reviewed.   ED Course  Procedures (including critical care time) Labs Review Labs Reviewed - No data to display  Imaging Review No results found.   EKG Interpretation None      MDM   Final diagnoses:  Right otitis externa    26F here with pain to her R ear. Treated with augmentin yesterday by her PCP for the same thing. Was given ear drops but they were too expensive for her to get. No fevers. Mild sinus congestion.  On exam, normal post auricular exam. No concern for mastoiditis. Unable to see R TM due to cerumen impaction. No pain on parotid gland, doubt parotitis. No dental abscess or swelling. Mild R anterior facial pain. Pain's shooting nature and ear area origin make me suspicious for ear related pathology. Will wash out the cerumen. After cleaning out wax, still unable to see TM, however ear canal extremely irritated. Will treat with polymyxin B.  Elwin Mocha, MD 11/10/14 [phone]

## 2015-01-17 ENCOUNTER — Emergency Department (HOSPITAL_BASED_OUTPATIENT_CLINIC_OR_DEPARTMENT_OTHER): Payer: Medicare Other

## 2015-01-17 ENCOUNTER — Emergency Department (HOSPITAL_BASED_OUTPATIENT_CLINIC_OR_DEPARTMENT_OTHER)
Admission: EM | Admit: 2015-01-17 | Discharge: 2015-01-17 | Disposition: A | Discharge status: Home / Self Care | Payer: Medicare Other | Attending: Emergency Medicine | Admitting: Emergency Medicine

## 2015-01-17 ENCOUNTER — Encounter (HOSPITAL_BASED_OUTPATIENT_CLINIC_OR_DEPARTMENT_OTHER): Payer: Self-pay | Admitting: *Deleted

## 2015-01-17 DIAGNOSIS — W108XXA Fall (on) (from) other stairs and steps, initial encounter: Secondary | ICD-10-CM | POA: Diagnosis not present

## 2015-01-17 DIAGNOSIS — S50812A Abrasion of left forearm, initial encounter: Secondary | ICD-10-CM

## 2015-01-17 DIAGNOSIS — S8262XA Displaced fracture of lateral malleolus of left fibula, initial encounter for closed fracture: Secondary | ICD-10-CM

## 2015-01-17 DIAGNOSIS — Z79899 Other long term (current) drug therapy: Secondary | ICD-10-CM | POA: Diagnosis not present

## 2015-01-17 DIAGNOSIS — E039 Hypothyroidism, unspecified: Secondary | ICD-10-CM | POA: Insufficient documentation

## 2015-01-17 DIAGNOSIS — M1990 Unspecified osteoarthritis, unspecified site: Secondary | ICD-10-CM | POA: Insufficient documentation

## 2015-01-17 DIAGNOSIS — F419 Anxiety disorder, unspecified: Secondary | ICD-10-CM | POA: Insufficient documentation

## 2015-01-17 DIAGNOSIS — Z8719 Personal history of other diseases of the digestive system: Secondary | ICD-10-CM | POA: Insufficient documentation

## 2015-01-17 DIAGNOSIS — S161XXA Strain of muscle, fascia and tendon at neck level, initial encounter: Secondary | ICD-10-CM

## 2015-01-17 DIAGNOSIS — S0990XA Unspecified injury of head, initial encounter: Secondary | ICD-10-CM

## 2015-01-17 DIAGNOSIS — Z8669 Personal history of other diseases of the nervous system and sense organs: Secondary | ICD-10-CM | POA: Insufficient documentation

## 2015-01-17 DIAGNOSIS — S8992XA Unspecified injury of left lower leg, initial encounter: Secondary | ICD-10-CM | POA: Diagnosis present

## 2015-01-17 DIAGNOSIS — S8001XA Contusion of right knee, initial encounter: Secondary | ICD-10-CM

## 2015-01-17 DIAGNOSIS — Y9389 Activity, other specified: Secondary | ICD-10-CM | POA: Insufficient documentation

## 2015-01-17 DIAGNOSIS — Y998 Other external cause status: Secondary | ICD-10-CM | POA: Diagnosis not present

## 2015-01-17 DIAGNOSIS — Z7901 Long term (current) use of anticoagulants: Secondary | ICD-10-CM | POA: Diagnosis not present

## 2015-01-17 DIAGNOSIS — S060X9A Concussion with loss of consciousness of unspecified duration, initial encounter: Secondary | ICD-10-CM | POA: Diagnosis not present

## 2015-01-17 DIAGNOSIS — W19XXXA Unspecified fall, initial encounter: Secondary | ICD-10-CM

## 2015-01-17 DIAGNOSIS — Y9289 Other specified places as the place of occurrence of the external cause: Secondary | ICD-10-CM | POA: Diagnosis not present

## 2015-01-17 NOTE — ED Notes (Signed)
Patient preparing for discharge. 

## 2015-01-17 NOTE — Discharge Instructions (Signed)
Call Dr. Lequita HaltAluisio to arrange a follow-up appointment in the next few days.   Return to the emergency department if you experience severe headache, worsening pain, or other new and concerning symptoms.  Ankle Fracture A fracture is a break in a bone. The ankle joint is made up of three bones. These include the lower (distal)sections of your lower leg bones, called the tibia and fibula, along with a bone in your foot, called the talus. Depending on how bad the break is and if more than one ankle joint bone is broken, a cast or splint is used to protect and keep your injured bone from moving while it heals. Sometimes, surgery is required to help the fracture heal properly.  There are two general types of fractures:  Stable fracture. This includes a single fracture line through one bone, with no injury to ankle ligaments. A fracture of the talus that does not have any displacement (movement of the bone on either side of the fracture line) is also stable.  Unstable fracture. This includes more than one fracture line through one or more bones in the ankle joint. It also includes fractures that have displacement of the bone on either side of the fracture line. CAUSES  A direct blow to the ankle.   Quickly and severely twisting your ankle.  Trauma, such as a car accident or falling from a significant height. RISK FACTORS You may be at a higher risk of ankle fracture if:  You have certain medical conditions.  You are involved in high-impact sports.  You are involved in a high-impact car accident. SIGNS AND SYMPTOMS   Tender and swollen ankle.  Bruising around the injured ankle.  Pain on movement of the ankle.  Difficulty walking or putting weight on the ankle.  A cold foot below the site of the ankle injury. This can occur if the blood vessels passing through your injured ankle were also damaged.  Numbness in the foot below the site of the ankle injury. DIAGNOSIS  An ankle fracture  is usually diagnosed with a physical exam and X-rays. A CT scan may also be required for complex fractures. TREATMENT  Stable fractures are treated with a cast or splint and using crutches to avoid putting weight on your injured ankle. This is followed by an ankle strengthening program. Some patients require a special type of cast, depending on other medical problems they may have. Unstable fractures require surgery to ensure the bones heal properly. Your health care provider will tell you what type of fracture you have and the best treatment for your condition. HOME CARE INSTRUCTIONS   Review correct crutch use with your health care provider and use your crutches as directed. Safe use of crutches is extremely important. Misuse of crutches can cause you to fall or cause injury to nerves in your hands or armpits.  Do not put weight or pressure on the injured ankle until directed by your health care provider.  To lessen the swelling, keep the injured leg elevated while sitting or lying down.  Apply ice to the injured area:  Put ice in a plastic bag.  Place a towel between your cast and the bag.  Leave the ice on for 20 minutes, 2-3 times a day.  If you have a plaster or fiberglass cast:  Do not try to scratch the skin under the cast with any objects. This can increase your risk of skin infection.  Check the skin around the cast every day. You may  put lotion on any red or sore areas.  Keep your cast dry and clean.  If you have a plaster splint:  Wear the splint as directed.  You may loosen the elastic around the splint if your toes become numb, tingle, or turn cold or blue.  Do not put pressure on any part of your cast or splint; it may break. Rest your cast only on a pillow the first 24 hours until it is fully hardened.  Your cast or splint can be protected during bathing with a plastic bag sealed to your skin with medical tape. Do not lower the cast or splint into water.  Take  medicines as directed by your health care provider. Only take over-the-counter or prescription medicines for pain, discomfort, or fever as directed by your health care provider.  Do not drive a vehicle until your health care provider specifically tells you it is safe to do so.  If your health care provider has given you a follow-up appointment, it is very important to keep that appointment. Not keeping the appointment could result in a chronic or permanent injury, pain, and disability. If you have any problem keeping the appointment, call the facility for assistance. SEEK MEDICAL CARE IF: You develop increased swelling or discomfort. SEEK IMMEDIATE MEDICAL CARE IF:   Your cast gets damaged or breaks.  You have continued severe pain.  You develop new pain or swelling after the cast was put on.  Your skin or toenails below the injury turn blue or gray.  Your skin or toenails below the injury feel cold, numb, or have loss of sensitivity to touch.  There is a bad smell or pus draining from under the cast. MAKE SURE YOU:   Understand these instructions.  Will watch your condition.  Will get help right away if you are not doing well or get worse. Document Released: 09/07/2000 Document Revised: 09/15/2013 Document Reviewed: 04/09/2013 [facility] Patient Information 2015 [place], [place]. This information is not intended to replace advice given to you by your health care provider. Make sure you discuss any questions you have with your health care provider.  Head Injury You have received a head injury. It does not appear serious at this time. Headaches and vomiting are common following head injury. It should be easy to awaken from sleeping. Sometimes it is necessary for you to stay in the emergency department for a while for observation. Sometimes admission to the hospital may be needed. After injuries such as yours, most problems occur within the first 24 hours, but side effects may occur up to  7-10 days after the injury. It is important for you to carefully monitor your condition and contact your health care provider or seek immediate medical care if there is a change in your condition. WHAT ARE THE TYPES OF HEAD INJURIES? Head injuries can be as minor as a bump. Some head injuries can be more severe. More severe head injuries include:  A jarring injury to the brain (concussion).  A bruise of the brain (contusion). This mean there is bleeding in the brain that can cause swelling.  A cracked skull (skull fracture).  Bleeding in the brain that collects, clots, and forms a bump (hematoma). WHAT CAUSES A HEAD INJURY? A serious head injury is most likely to happen to someone who is in a car wreck and is not wearing a seat belt. Other causes of major head injuries include bicycle or motorcycle accidents, sports injuries, and falls. HOW ARE HEAD INJURIES DIAGNOSED?  A complete history of the event leading to the injury and your current symptoms will be helpful in diagnosing head injuries. Many times, pictures of the brain, such as CT or MRI are needed to see the extent of the injury. Often, an overnight hospital stay is necessary for observation.  WHEN SHOULD I SEEK IMMEDIATE MEDICAL CARE?  You should get help right away if:  You have confusion or drowsiness.  You feel sick to your stomach (nauseous) or have continued, forceful vomiting.  You have dizziness or unsteadiness that is getting worse.  You have severe, continued headaches not relieved by medicine. Only take over-the-counter or prescription medicines for pain, fever, or discomfort as directed by your health care provider.  You do not have normal function of the arms or legs or are unable to walk.  You notice changes in the black spots in the center of the colored part of your eye (pupil).  You have a clear or bloody fluid coming from your nose or ears.  You have a loss of vision. During the next 24 hours after the  injury, you must stay with someone who can watch you for the warning signs. This person should contact local emergency services (911 in the [place] if you have seizures, you become unconscious, or you are unable to wake up. HOW CAN I PREVENT A HEAD INJURY IN THE FUTURE? The most important factor for preventing major head injuries is avoiding motor vehicle accidents. To minimize the potential for damage to your head, it is crucial to wear seat belts while riding in motor vehicles. Wearing helmets while bike riding and playing collision sports (like football) is also helpful. Also, avoiding dangerous activities around the house will further help reduce your risk of head injury.  WHEN CAN I RETURN TO NORMAL ACTIVITIES AND ATHLETICS? You should be reevaluated by your health care provider before returning to these activities. If you have any of the following symptoms, you should not return to activities or contact sports until 1 week after the symptoms have stopped:  Persistent headache.  Dizziness or vertigo.  Poor attention and concentration.  Confusion.  Memory problems.  Nausea or vomiting.  Fatigue or tire easily.  Irritability.  Intolerant of bright lights or loud noises.  Anxiety or depression.  Disturbed sleep. MAKE SURE YOU:   Understand these instructions.  Will watch your condition.  Will get help right away if you are not doing well or get worse. Document Released: 09/10/2005 Document Revised: 09/15/2013 Document Reviewed: 05/18/2013 [facility] Patient Information 2015 [place], [place]. This information is not intended to replace advice given to you by your health care provider. Make sure you discuss any questions you have with your health care provider.

## 2015-01-17 NOTE — ED Notes (Signed)
Slipped on steps. Hit the back of her head. Loc. Abrasion to her forearm. Pain in her right knee. Swelling and pain to her left ankle.

## 2015-01-17 NOTE — ED Provider Notes (Signed)
CSN: [id number]     Arrival date & time 01/17/15  1310 History  This chart was scribed for Geoffery Lyons, MD by Ronney Lion, ED Scribe. This patient was seen in room MH08/MH08 and the patient's care was started at 4:02 PM.    Chief Complaint  Patient presents with  . Fall   Patient is a 69 y.o. female presenting with fall. The history is provided by the patient. No language interpreter was used.  Fall This is a new problem. The current episode started 3 to 5 hours ago. The problem occurs rarely. Pertinent negatives include no chest pain and no shortness of breath. Nothing aggravates the symptoms. Nothing relieves the symptoms. She has tried nothing for the symptoms.     HPI Comments: Cathy Fitzpatrick is a 70 y.o. female who presents to the Emergency Department complaining of a fall that occurred 4 hours ago. Patient was walking down 2 wooden steps in her basement and hit her head on the body of a car, and had LOC. Her husband witnessed her fall and told her she lost consciousness for a minute or 2. Patient felt immediately nauseated afterwards. She now complains of right knee pain and worsening swelling, left ankle pain, and an abrasion to her left forearm. Patient has a history of left knee arthoplasty. She denies blood thinner use. She denies neck pain, chest pain, or SOB.   Past Medical History  Diagnosis Date  . Macular degeneration, dry     right side, slight  . GERD (gastroesophageal reflux disease)   . Hypothyroidism   . Anxiety   . Arthritis   . PONV (postoperative nausea and vomiting)    Past Surgical History  Procedure Laterality Date  . Meniscus repair Left 17 years ago  . Knee arthroscopy Left 17 years ago    "cleaned it out and aligned patella"  . Tubal ligation  40 years ago  . Bunionectomy Bilateral 2014  . 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 History  Substance Use Topics  . Smoking status: Never Smoker   . Smokeless tobacco: Never Used  . Alcohol Use: Yes     Comment: social   OB History    No data available     Review of Systems  Respiratory: Negative for shortness of breath.   Cardiovascular: Negative for chest pain.  Musculoskeletal: Positive for arthralgias (right knee and left ankle pain). Negative for neck pain.  Skin: Positive for wound.  All other systems reviewed and are negative.     Allergies  Codeine  Home Medications   Prior to Admission medications   Medication Sig Start Date End Date Taking? Authorizing Provider  amitriptyline (ELAVIL) 25 MG tablet Take 25 mg by mouth at bedtime.      Historical Provider, MD  FLUoxetine (PROZAC) 20 MG tablet Take 20 mg by mouth every morning.     Historical Provider, MD  HYDROcodone-acetaminophen (NORCO/VICODIN) 5-325 MG per tablet Take 1-2 tablets by mouth every 4 (four) hours as needed for moderate pain. 11/04/13   Avel Peace, PA-C  levothyroxine (SYNTHROID, LEVOTHROID) 25 MCG tablet Take 25 mcg by mouth daily before breakfast.     Historical Provider, MD  methocarbamol (ROBAXIN) 500 MG tablet Take 1 tablet (500 mg total) by mouth every 6 (six) hours as needed for muscle spasms. 11/04/13  Avel Peacerew Perkins, PA-C  neomycin-polymyxin-hydrocortisone (CORTISPORIN) 3.5-10000-1 otic suspension Place 4 drops into the right ear 4 (four) times daily. X 7 days 11/10/14   Elwin MochaBlair Walden, MD  oxyCODONE (OXY IR/ROXICODONE) 5 MG immediate release tablet Take 1-2 tablets (5-10 mg total) by mouth every 3 (three) hours as needed for moderate pain or severe pain. 11/04/13   Avel Peacerew Perkins, PA-C  rivaroxaban (XARELTO) 10 MG TABS tablet Take 1 tablet (10 mg total) by mouth daily with breakfast. Take Xarelto for two and a half more weeks, then discontinue Xarelto. Once the patient has completed the blood thinner regimen, then take a Baby 81 mg Aspirin daily for  four more weeks. 11/04/13   Avel Peacerew Perkins, PA-C   BP 141/83 mmHg  Pulse 76  Temp(Src) 98 F (36.7 C) (Oral)  Resp 20  Ht 5\' 6"  (1.676 m)  Wt 160 lb (72.576 kg)  BMI 25.84 kg/m2  SpO2 100% Physical Exam  Constitutional: She is oriented to person, place, and time. She appears well-developed and well-nourished. No distress.  HENT:  Head: Normocephalic and atraumatic.  Right Ear: Hearing normal.  Left Ear: Hearing normal.  Nose: Nose normal.  Mouth/Throat: Oropharynx is clear and moist and mucous membranes are normal.  Eyes: Conjunctivae and EOM are normal. Pupils are equal, round, and reactive to light.  Neck: Normal range of motion. Neck supple.  Cardiovascular: Regular rhythm, S1 normal and S2 normal.  Exam reveals no gallop and no friction rub.   No murmur heard. Pulmonary/Chest: Effort normal and breath sounds normal. No respiratory distress. She exhibits no tenderness.  Abdominal: Soft. Normal appearance and bowel sounds are normal. There is no hepatosplenomegaly. There is no tenderness. There is no rebound, no guarding, no tenderness at McBurney's point and negative Murphy's sign. No hernia.  Musculoskeletal: Normal range of motion. She exhibits edema.  There is a 2 cm skin tear to volar aspect of the left forearm.   Left ankle has swelling over the lateral malleolus. No fifth MT tenderness.  Right knee has mild swelling/effusion. At full ROM, with minimal discomfort. There is no crepitus. Stable AP and laterally.  Neurological: She is alert and oriented to person, place, and time. She has normal strength. No cranial nerve deficit or sensory deficit. Coordination normal. GCS eye subscore is 4. GCS verbal subscore is 5. GCS motor subscore is 6.  Skin: Skin is warm, dry and intact. No rash noted. No cyanosis.  Psychiatric: She has a normal mood and affect. Her speech is normal and behavior is normal. Thought content normal.  Nursing note and vitals reviewed.   ED Course   Procedures (including critical care time)  DIAGNOSTIC STUDIES: Oxygen Saturation is 100% on RA, normal by my interpretation.    COORDINATION OF CARE: 4:07 PM - Discussed imaging results with patient. Also discussed treatment plan with pt at bedside which includes right knee XR, and pt agreed to plan.   Imaging Review Dg Ankle Complete Left  01/17/2015   CLINICAL DATA:  Lateral ankle pain and swelling after fall down steps. Initial encounter.  EXAM: LEFT ANKLE COMPLETE - 3+ VIEW  COMPARISON:  None.  FINDINGS: Small, indistinct crescentic bone fragments below the fibula with lateral malleolus donor site. There is associated soft tissue swelling.  Normal ankle alignment.  Partially visualized first metatarsal surgery with MTP osteoarthritis.  IMPRESSION: Tiny avulsion fracture from the lateral malleolus.   Electronically Signed   By: Marnee SpringJonathon  Watts M.D.   On: 01/17/2015 14:14   Ct Head  Wo Contrast  01/17/2015   CLINICAL DATA:  Status post fall with a blow to the head today. Loss of consciousness.  EXAM: CT HEAD WITHOUT CONTRAST  CT CERVICAL SPINE WITHOUT CONTRAST  TECHNIQUE: Multidetector CT imaging of the head and cervical spine was performed following the standard protocol without intravenous contrast. Multiplanar CT image reconstructions of the cervical spine were also generated.  COMPARISON:  None.  FINDINGS: CT HEAD FINDINGS  The brain appears normal without hemorrhage, infarct, mass lesion, mass effect, midline shift or abnormal extra-axial fluid collection. No hydrocephalus or pneumocephalus. The calvarium is intact. Carotid atherosclerosis is noted. Imaged paranasal sinuses and mastoid air cells are clear.  CT CERVICAL SPINE FINDINGS  No fracture or malalignment of the cervical spine is identified. There is multilevel loss of disc space height and endplate spurring, most notable at C5-6 and C6-7. Lung apices are clear. Paraspinous soft tissue structures are unremarkable.  IMPRESSION: No acute  abnormality head or cervical spine.  Atherosclerosis.  Cervical degenerative disease.   Electronically Signed   By: Drusilla Kanner M.D.   On: 01/17/2015 14:11   Ct Cervical Spine Wo Contrast  01/17/2015   CLINICAL DATA:  Status post fall with a blow to the head today. Loss of consciousness.  EXAM: CT HEAD WITHOUT CONTRAST  CT CERVICAL SPINE WITHOUT CONTRAST  TECHNIQUE: Multidetector CT imaging of the head and cervical spine was performed following the standard protocol without intravenous contrast. Multiplanar CT image reconstructions of the cervical spine were also generated.  COMPARISON:  None.  FINDINGS: CT HEAD FINDINGS  The brain appears normal without hemorrhage, infarct, mass lesion, mass effect, midline shift or abnormal extra-axial fluid collection. No hydrocephalus or pneumocephalus. The calvarium is intact. Carotid atherosclerosis is noted. Imaged paranasal sinuses and mastoid air cells are clear.  CT CERVICAL SPINE FINDINGS  No fracture or malalignment of the cervical spine is identified. There is multilevel loss of disc space height and endplate spurring, most notable at C5-6 and C6-7. Lung apices are clear. Paraspinous soft tissue structures are unremarkable.  IMPRESSION: No acute abnormality head or cervical spine.  Atherosclerosis.  Cervical degenerative disease.   Electronically Signed   By: Drusilla Kanner M.D.   On: 01/17/2015 14:11   Dg Knee Complete 4 Views Right  01/17/2015   CLINICAL DATA:  Fall down 3 stairs today with right knee injury. Anterior pain and swelling and worsening pain upon weight-bearing and flexion.  EXAM: RIGHT KNEE - COMPLETE 4+ VIEW  COMPARISON:  None.  FINDINGS: Moderate degenerative changes of the medial compartment and patellofemoral joints. Possible nondisplaced fracture of the midportion of the patella seen only on the oblique view. No significant joint effusion.  IMPRESSION: Possible nondisplaced fracture of the patella seen only on the oblique view. Consider  CT versus followup radiograph 10-14 days.  Moderate osteoarthritis.   Electronically Signed   By: Elberta Fortis M.D.   On: 01/17/2015 16:34    MDM   Final diagnoses:  None     Patient presents after a fall down several steps. She hit her head and was knocked briefly unconscious. She is also complaining of pain in her right knee, left ankle, and has an abrasion to her left forearm. Imaging studies of these areas are all unremarkable with the exception of a possible hairline fracture of the patella seen on one view. She does appear to be tender in this area and will be placed in a knee immobilizer.  The radiologist recommends either repeat x-rays in  10-14 days or a CT scan to better define. Patient has been here several hours and is tired and wants to go home. She understands that she will wear this immobilizer until followed up by her orthopedic surgeon. She has seen Dr. Lequita Halt in the past and states she will follow-up with him.   I personally performed the services described in this documentation, which was scribed in my presence. The recorded information has been reviewed and is accurate.      Geoffery Lyons, MD 01/17/15 [phone]

## 2015-06-21 IMAGING — DX DG KNEE COMPLETE 4+V*R*
4 series · 4 of 4 positions shown · non-contrast
Comparison: None.

CLINICAL DATA: Fall down 3 stairs today with right knee injury.
Anterior pain and swelling and worsening pain upon weight-bearing
and flexion.

EXAM:
RIGHT KNEE - COMPLETE 4+ VIEW

[knee lat]
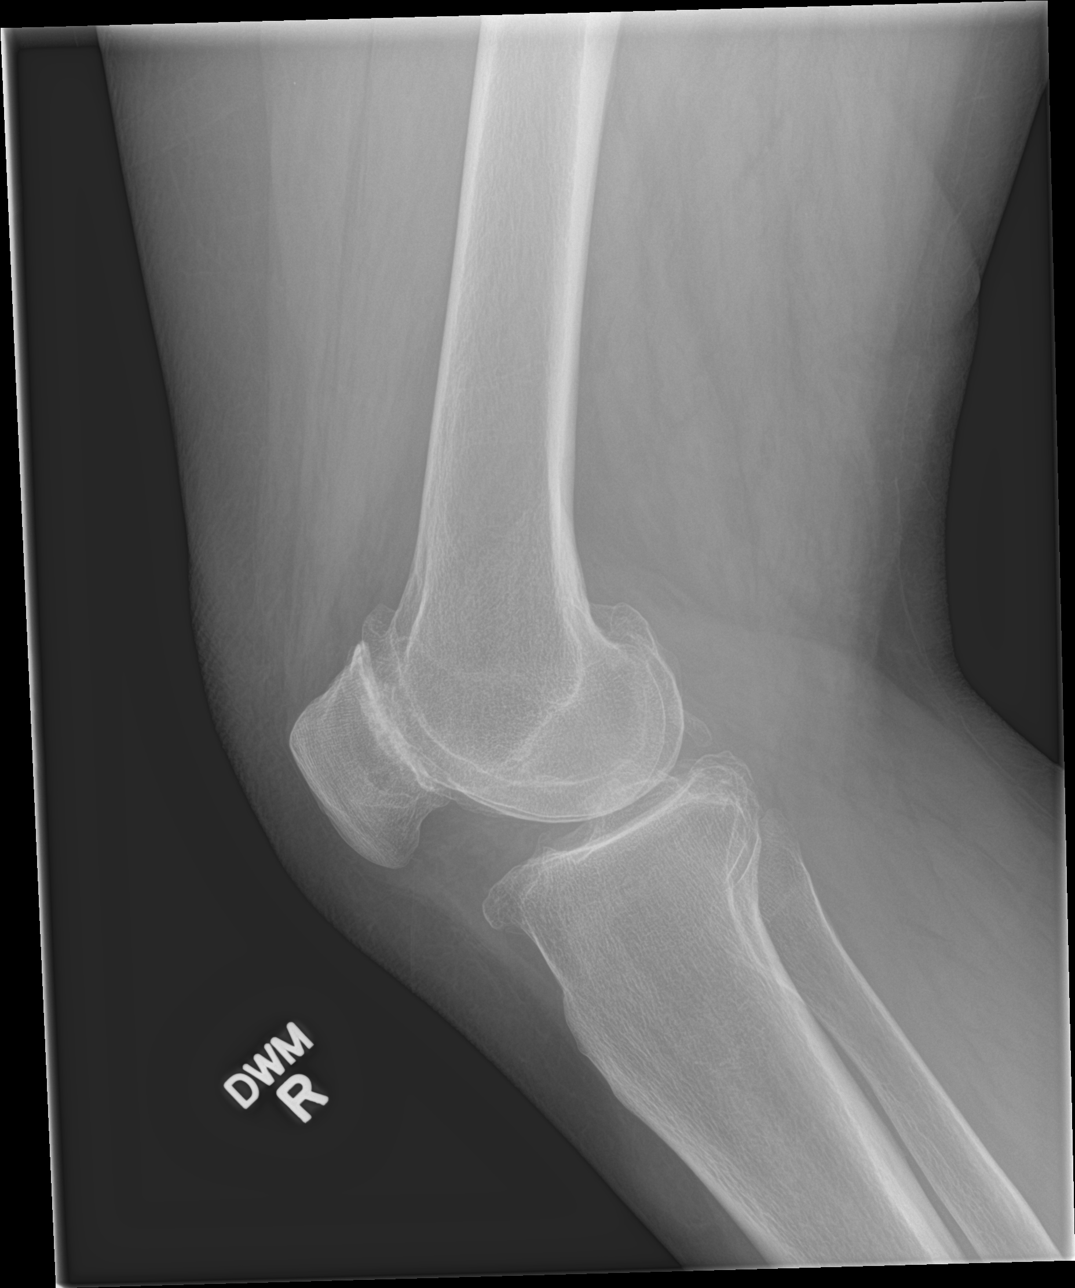

[knee obl (1 of 3)]
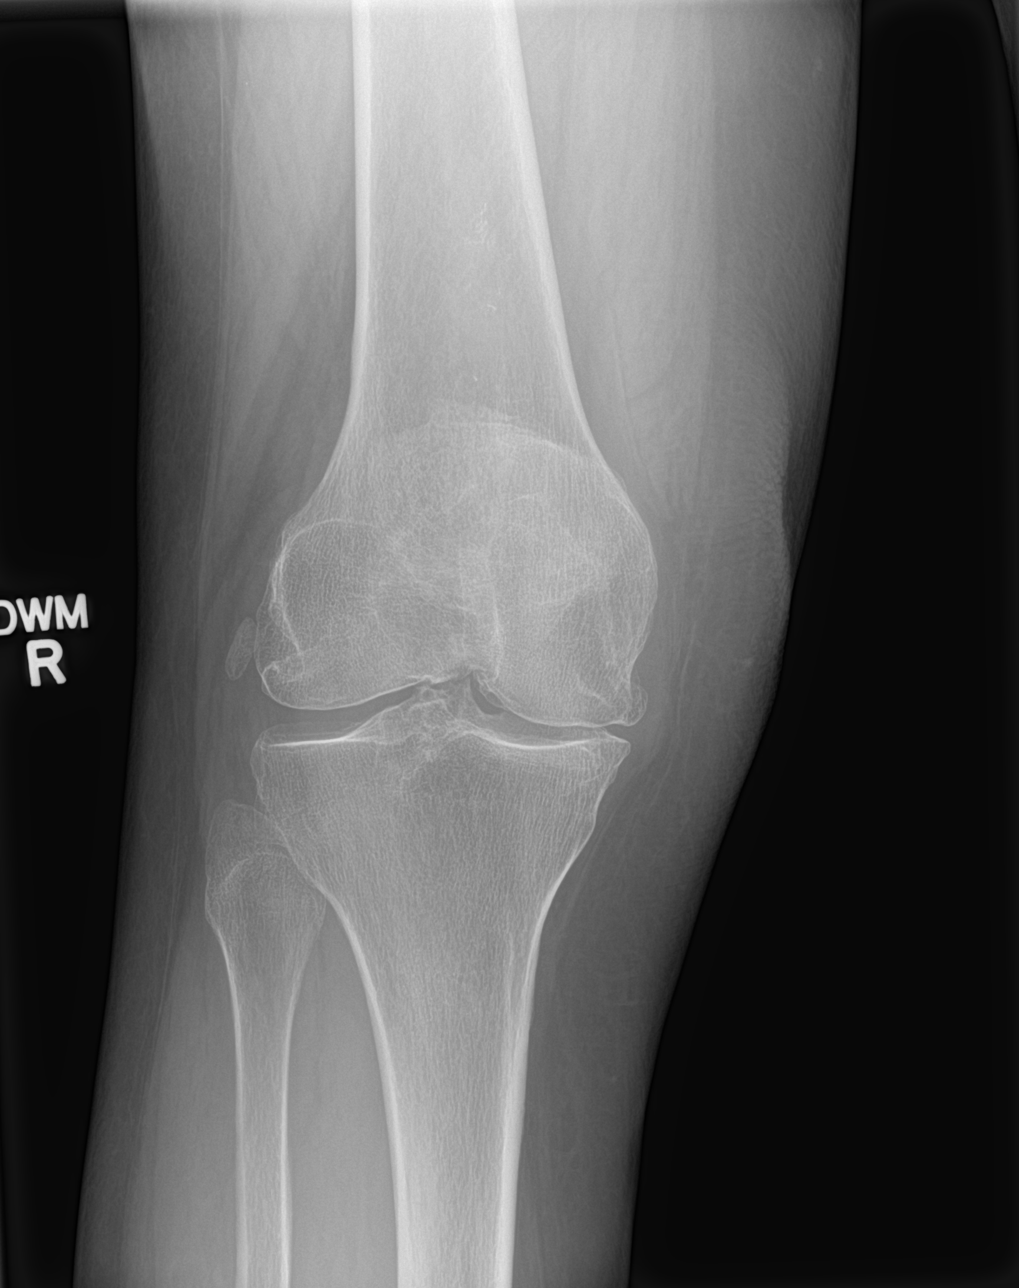

[knee obl (2 of 3)]
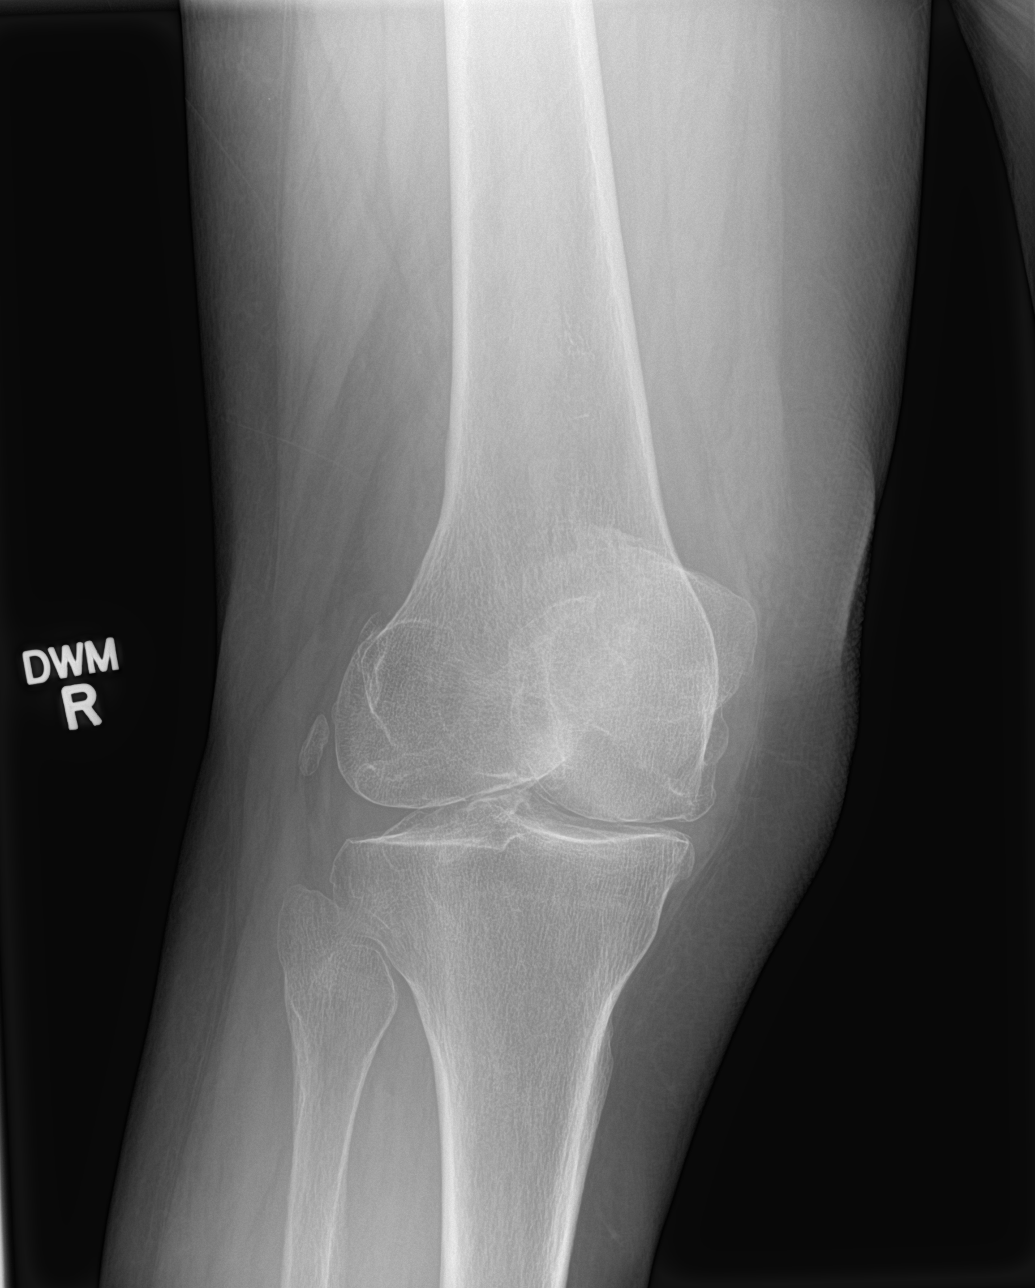

[knee obl (3 of 3)]
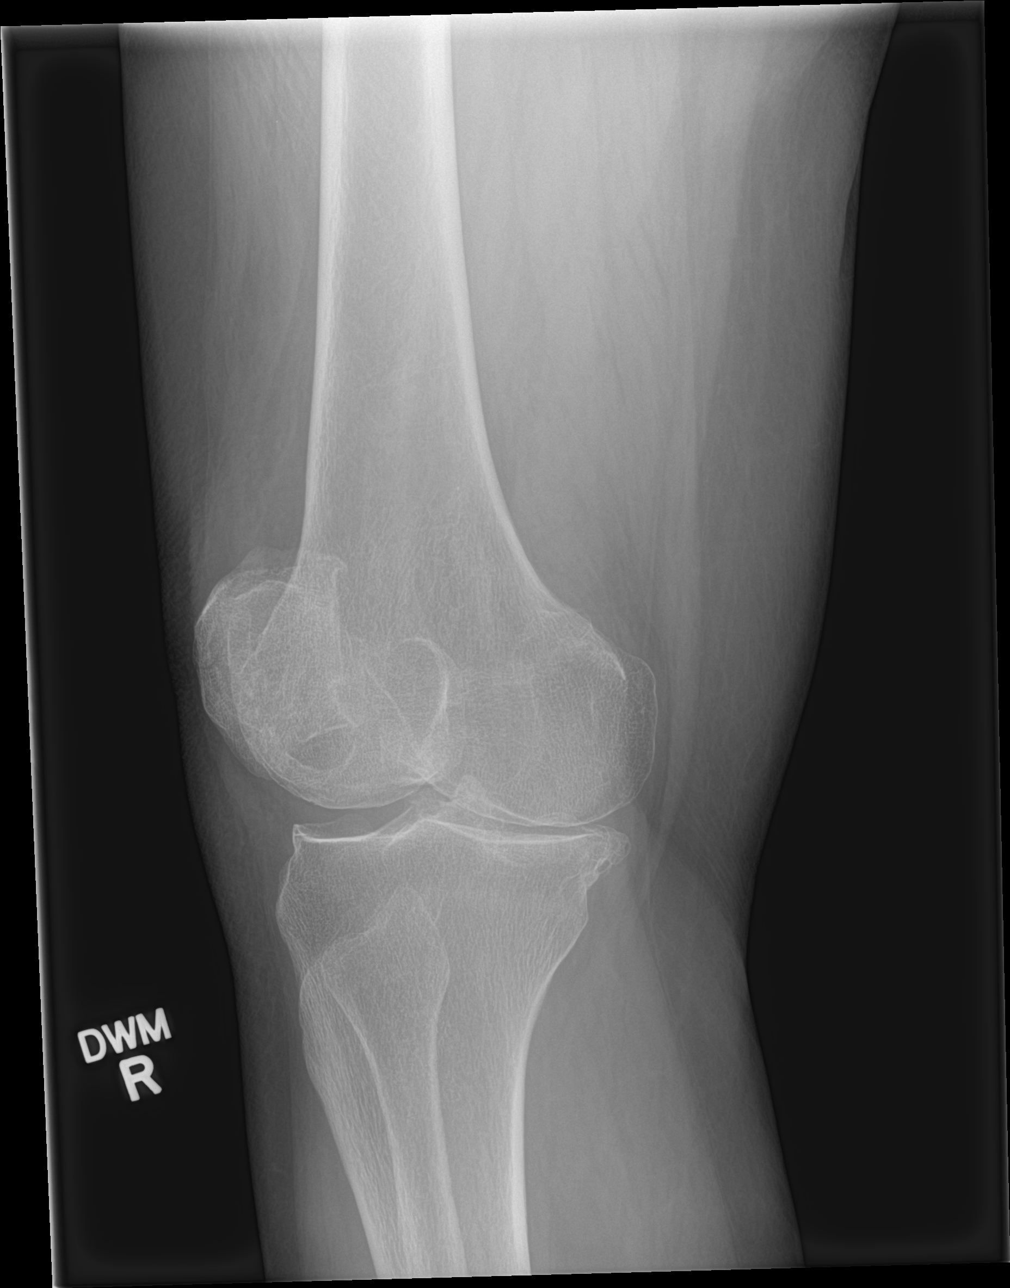

[4 of 4 positions shown; findings below may reference images not displayed]

FINDINGS: Moderate degenerative changes of the medial compartment and
patellofemoral joints. Possible nondisplaced fracture of the
midportion of the patella seen only on the oblique view. No
significant joint effusion.
IMPRESSION: Possible nondisplaced fracture of the patella seen only on the
oblique view. Consider CT versus followup radiograph 10-14 days.

Moderate osteoarthritis.

## 2016-11-14 ENCOUNTER — Ambulatory Visit (INDEPENDENT_AMBULATORY_CARE_PROVIDER_SITE_OTHER): Payer: Medicare Other | Admitting: Podiatry

## 2016-11-14 ENCOUNTER — Encounter: Payer: Self-pay | Admitting: Podiatry

## 2016-11-14 ENCOUNTER — Ambulatory Visit (INDEPENDENT_AMBULATORY_CARE_PROVIDER_SITE_OTHER): Payer: Medicare Other

## 2016-11-14 VITALS — Resp 16 | Ht 65.5 in | Wt 153.0 lb

## 2016-11-14 DIAGNOSIS — Q828 Other specified congenital malformations of skin: Secondary | ICD-10-CM

## 2016-11-14 DIAGNOSIS — M2040 Other hammer toe(s) (acquired), unspecified foot: Secondary | ICD-10-CM | POA: Diagnosis not present

## 2016-11-14 NOTE — Progress Notes (Signed)
Subjective:     Patient ID: Hollace HaywardSusan Ewen, female   DOB: 06/29/1946, 71 y.o.   MRN: [id number]  HPI patient presents stating I was concerned about reoccurrence of hammertoe second digit left over right and I have these lesions underneath both feet that can become painful   Review of Systems     Objective:   Physical Exam Neurovascular status intact negative Homans sign was noted with patient found to have previous structural correction of bunions and hammertoes bilateral with mild inflammation between the hallux second toe left over right and is found to have lesion on the bottom of the left and the right foot    Assessment:     Doing well post 1 unit correction digital corrections bilateral with porokeratotic lesion bilateral    Plan:     H&P x-rays reviewed and discussed wider shoe gear and possible padding for the toes. I then went ahead today and I debrided lesions on both feet with no iatrogenic bleeding and will be seen back as needed for this procedure  X-ray report indicated osteotomy is healed well digits are in good alignment with minimal distal disruption

## 2016-11-14 NOTE — Progress Notes (Signed)
   Subjective:    Patient ID: Cathy HaywardSusan Fitzpatrick, female    DOB: 07/18/1946, 71 y.o.   MRN: [id number]  HPI  Chief Complaint  Patient presents with  . Toe Pain    Left; 2nd toe. Pt is concerned that the toe is "turning outward". Requesting x-rays of both of her feet to make sure toes are still straight. Had BL bunionectomy and hammer toe surgery in 2nd toes years ago.        Review of Systems     Objective:   Physical Exam        Assessment & Plan:

## 2016-12-05 ENCOUNTER — Telehealth: Payer: Self-pay

## 2016-12-05 NOTE — Telephone Encounter (Signed)
Pt no-showed her new patient appt this morning. 

## 2018-01-22 ENCOUNTER — Emergency Department (HOSPITAL_COMMUNITY)
Admission: EM | Admit: 2018-01-22 | Discharge: 2018-01-22 | Disposition: A | Discharge status: Home / Self Care | Payer: Medicare Other | Attending: Emergency Medicine | Admitting: Emergency Medicine

## 2018-01-22 ENCOUNTER — Other Ambulatory Visit: Payer: Self-pay

## 2018-01-22 ENCOUNTER — Encounter (HOSPITAL_COMMUNITY): Payer: Self-pay

## 2018-01-22 ENCOUNTER — Emergency Department (HOSPITAL_COMMUNITY): Payer: Medicare Other

## 2018-01-22 DIAGNOSIS — E039 Hypothyroidism, unspecified: Secondary | ICD-10-CM | POA: Insufficient documentation

## 2018-01-22 DIAGNOSIS — Z79899 Other long term (current) drug therapy: Secondary | ICD-10-CM | POA: Diagnosis not present

## 2018-01-22 DIAGNOSIS — W230XXA Caught, crushed, jammed, or pinched between moving objects, initial encounter: Secondary | ICD-10-CM | POA: Insufficient documentation

## 2018-01-22 DIAGNOSIS — Z96652 Presence of left artificial knee joint: Secondary | ICD-10-CM | POA: Insufficient documentation

## 2018-01-22 DIAGNOSIS — Y929 Unspecified place or not applicable: Secondary | ICD-10-CM | POA: Insufficient documentation

## 2018-01-22 DIAGNOSIS — Y939 Activity, unspecified: Secondary | ICD-10-CM | POA: Diagnosis not present

## 2018-01-22 DIAGNOSIS — Z7901 Long term (current) use of anticoagulants: Secondary | ICD-10-CM | POA: Diagnosis not present

## 2018-01-22 DIAGNOSIS — S61210A Laceration without foreign body of right index finger without damage to nail, initial encounter: Secondary | ICD-10-CM | POA: Diagnosis not present

## 2018-01-22 DIAGNOSIS — Y999 Unspecified external cause status: Secondary | ICD-10-CM | POA: Insufficient documentation

## 2018-01-22 DIAGNOSIS — Z23 Encounter for immunization: Secondary | ICD-10-CM | POA: Diagnosis not present

## 2018-01-22 DIAGNOSIS — S60021A Contusion of right index finger without damage to nail, initial encounter: Secondary | ICD-10-CM

## 2018-01-22 MED ORDER — LIDOCAINE HCL URETHRAL/MUCOSAL 2 % EX GEL
1.0000 "application " | Freq: Once | CUTANEOUS | Status: AC
  Administered 2018-01-22: 1 via TOPICAL
  Filled 2018-01-22: qty 5

## 2018-01-22 MED ORDER — BACITRACIN ZINC 500 UNIT/GM EX OINT
TOPICAL_OINTMENT | Freq: Once | CUTANEOUS | Status: AC
  Administered 2018-01-22: 1 via TOPICAL

## 2018-01-22 MED ORDER — TETANUS-DIPHTH-ACELL PERTUSSIS 5-2.5-18.5 LF-MCG/0.5 IM SUSP
0.5000 mL | Freq: Once | INTRAMUSCULAR | Status: AC
  Administered 2018-01-22: 0.5 mL via INTRAMUSCULAR
  Filled 2018-01-22: qty 0.5

## 2018-01-22 MED ORDER — IBUPROFEN 200 MG PO TABS
600.0000 mg | ORAL_TABLET | Freq: Once | ORAL | Status: AC
  Administered 2018-01-22: 600 mg via ORAL
  Filled 2018-01-22: qty 3

## 2018-01-22 NOTE — ED Provider Notes (Signed)
[facility]-EMERGENCY DEPT Provider Note   CSN: [id number] Arrival date & time: 01/22/18  1144     History   Chief Complaint Chief Complaint  Patient presents with  . Finger Injury    HPI Cathy Fitzpatrick is a 72 y.o. female who presents to the ED for an injury to her finger. The injury is located on the right index finger. The injury occurred just prior to arrival to the ED. Patient reports that she closed her car door and her finger was in the door. The door completely closed and she had to open it to get her finger out. She c/o swelling and laceration to the finger.   HPI  Past Medical History:  Diagnosis Date  . Anxiety   . Arthritis   . GERD (gastroesophageal reflux disease)   . Hypothyroidism   . Macular degeneration, dry    right side, slight  . PONV (postoperative nausea and vomiting)     Patient Active Problem List   Diagnosis Date Noted  . Hyponatremia 11/03/2013  . Postoperative anemia due to acute blood loss 11/03/2013  . OA (osteoarthritis) of knee 11/02/2013    Past Surgical History:  Procedure Laterality Date  . BUNIONECTOMY Bilateral 2014  . KNEE ARTHROSCOPY Left 17 years ago   "cleaned it out and aligned patella"  . MENISCUS REPAIR Left 17 years ago  . TOTAL KNEE ARTHROPLASTY Left 11/02/2013   Procedure: LEFT TOTAL KNEE ARTHROPLASTY;  Surgeon: Loanne Drilling, MD;  Location: [facility];  Service: Orthopedics;  Laterality: Left;  . TUBAL LIGATION  40 years ago     OB History   None      Home Medications    Prior to Admission medications   Medication Sig Start Date End Date Taking? Authorizing Provider  amitriptyline (ELAVIL) 25 MG tablet Take 25 mg by mouth at bedtime.      [provider]  FLUoxetine (PROZAC) 20 MG tablet Take 20 mg by mouth every morning.     [provider]  HYDROcodone-acetaminophen (NORCO/VICODIN) 5-325 MG per tablet Take 1-2 tablets by mouth every 4 (four) hours as needed for moderate pain.  11/04/13   Perkins, Alexzandrew L, PA-C  levothyroxine (SYNTHROID, LEVOTHROID) 25 MCG tablet Take 25 mcg by mouth daily before breakfast.     [provider]  methocarbamol (ROBAXIN) 500 MG tablet Take 1 tablet (500 mg total) by mouth every 6 (six) hours as needed for muscle spasms. 11/04/13   Perkins, Alexzandrew L, PA-C  neomycin-polymyxin-hydrocortisone (CORTISPORIN) 3.5-10000-1 otic suspension Place 4 drops into the right ear 4 (four) times daily. X 7 days 11/10/14   Elwin Mocha, MD  oxyCODONE (OXY IR/ROXICODONE) 5 MG immediate release tablet Take 1-2 tablets (5-10 mg total) by mouth every 3 (three) hours as needed for moderate pain or severe pain. 11/04/13   Perkins, Alexzandrew L, PA-C  rivaroxaban (XARELTO) 10 MG TABS tablet Take 1 tablet (10 mg total) by mouth daily with breakfast. Take Xarelto for two and a half more weeks, then discontinue Xarelto. Once the patient has completed the blood thinner regimen, then take a Baby 81 mg Aspirin daily for four more weeks. 11/04/13   Perkins, Alexzandrew L, PA-C    Family History History reviewed. No pertinent family history.  Social History Social History   Tobacco Use  . Smoking status: Never Smoker  . Smokeless tobacco: Never Used  Substance Use Topics  . Alcohol use: Yes    Comment: social  . Drug use:  No     Allergies   Codeine   Review of Systems Review of Systems  Musculoskeletal: Positive for arthralgias.  Skin: Positive for color change and wound.  All other systems reviewed and are negative.    Physical Exam Updated Vital Signs BP (!) 148/81 (BP Location: Right Arm)   Pulse 80   Temp 98.4 F (36.9 C) (Oral)   Resp 18   Ht 5' 5.5" (1.664 m)   Wt 70.8 kg (156 lb)   SpO2 99%   BMI 25.56 kg/m   Physical Exam  Constitutional: She appears well-developed and well-nourished. No distress.  HENT:  Head: Normocephalic and atraumatic.  Eyes: EOM are normal.  Neck: Neck supple.  Cardiovascular: Normal rate.    Pulmonary/Chest: Effort normal.  Musculoskeletal: Normal range of motion.       Right hand: She exhibits laceration and swelling. She exhibits normal range of motion, normal capillary refill and no deformity. Normal sensation noted. Normal strength noted.       Hands: There is a laceration to the dorsum of the right index finger, distal aspect. There is ecchymosis noted to the palmar aspect.   Neurological: She is alert.  Skin: Skin is warm and dry.  Psychiatric: She has a normal mood and affect.  Nursing note and vitals reviewed.    ED Treatments / Results  Labs (all labs ordered are listed, but only abnormal results are displayed) Labs Reviewed - No data to display  Radiology Dg Finger Index Right  Result Date: 01/22/2018 CLINICAL DATA:  Crush injury in a car door today with a laceration and bruising over the mid portion of the finger. EXAM: RIGHT INDEX FINGER 2+V COMPARISON:  None in PACs FINDINGS: The bones are subjectively adequately mineralized. There is no acute fracture nor dislocation. The joint spaces exhibit no acute abnormalities. There is mild narrowing of the DIP joint. The overlying soft tissues exhibit no foreign bodies. IMPRESSION: There is no acute bony abnormality of the right index finger. There is mild degenerative narrowing of the DIP joint. Electronically Signed   By: David  [place] M.D.   On: 01/22/2018 12:41    Procedures Procedures (including critical care time)  Medications Ordered in ED Medications  bacitracin ointment (has no administration in time range)  Tdap (BOOSTRIX) injection 0.5 mL (0.5 mLs Intramuscular Given 01/22/18 1252)  ibuprofen (ADVIL,MOTRIN) tablet 600 mg (600 mg Oral Given 01/22/18 1240)  lidocaine (XYLOCAINE) 2 % jelly 1 application (1 application Topical Given 01/22/18 1254)   Dr. Patria Mane in to see the patient and agrees no sutures indicated and discussed with the patient plan of care. Wound cleaned, antibiotic ointment and dressing. Return  precautions discussed. Patient stable for d/c without focal neuro deficits.  Initial Impression / Assessment and Plan / ED Course  I have reviewed the triage vital signs and the nursing notes.   Final Clinical Impressions(s) / ED Diagnoses   Final diagnoses:  Laceration of right index finger without foreign body without damage to nail, initial encounter  Contusion of right index finger without damage to nail, initial encounter    ED Discharge Orders    None       Kerrie Buffalo [place], [place] 01/22/18 1331    Azalia Bilis, MD 01/22/18 1652

## 2018-01-22 NOTE — ED Notes (Signed)
Using clean technique, cleansed right index finger with normal saline. Applied BACITRACIN ointment. Covered with a telfa pad and secured with a band aid. Patient tolerated it well.

## 2018-01-22 NOTE — ED Notes (Signed)
Bed: WTR7 Expected date:  Expected time:  Means of arrival:  Comments: 

## 2018-01-22 NOTE — ED Triage Notes (Signed)
Pt was shopping this morning and shut her right index finger in car door. Pt states the door completely shut on her finger. Denies taking blood thinners. Bleeding controlled at this time.

## 2018-01-22 NOTE — Discharge Instructions (Signed)
Take tylenol and ibuprofen as needed for pain. Elevate the area as often as possible. Follow up with your doctor or return here for worsening symptoms.

## 2018-01-22 NOTE — ED Notes (Signed)
Patient transported to X-ray 

## 2019-09-25 DIAGNOSIS — Z8781 Personal history of (healed) traumatic fracture: Secondary | ICD-10-CM

## 2019-09-25 DIAGNOSIS — I619 Nontraumatic intracerebral hemorrhage, unspecified: Secondary | ICD-10-CM

## 2019-09-25 HISTORY — DX: Personal history of (healed) traumatic fracture: Z87.81

## 2019-09-25 HISTORY — DX: Nontraumatic intracerebral hemorrhage, unspecified: I61.9

## 2019-10-16 ENCOUNTER — Ambulatory Visit: Payer: Medicare Other

## 2019-10-17 ENCOUNTER — Ambulatory Visit: Payer: Medicare Other | Attending: Internal Medicine

## 2019-10-17 DIAGNOSIS — Z23 Encounter for immunization: Secondary | ICD-10-CM | POA: Insufficient documentation

## 2019-10-17 NOTE — Progress Notes (Signed)
   Covid-19 Vaccination Clinic  Name:  Cathy Fitzpatrick    MRN: [id number] DOB: [birth year]  10/17/2019  [name] was observed post Covid-19 immunization for 15 minutes without incidence. She was provided with Vaccine Information Sheet and instruction to access the V-Safe system.   [name] was instructed to call 911 with any severe reactions post vaccine: Marland Kitchen Difficulty breathing  . Swelling of your face and throat  . A fast heartbeat  . A bad rash all over your body  . Dizziness and weakness    Immunizations Administered    Name Date Dose VIS Date Route   Pfizer COVID-19 Vaccine 10/17/2019 12:28 PM 0.3 mL 09/04/2019 Intramuscular   Manufacturer: [employer], [employer]   Lot: [id number]   NDC: [address]-0

## 2019-11-07 ENCOUNTER — Ambulatory Visit: Payer: Medicare Other | Attending: Internal Medicine

## 2019-11-07 DIAGNOSIS — Z23 Encounter for immunization: Secondary | ICD-10-CM

## 2019-11-07 NOTE — Progress Notes (Signed)
   Covid-19 Vaccination Clinic  Name:  Cathy Fitzpatrick    MRN: [id number] DOB: Jul 28, 1946  11/07/2019  Cathy Fitzpatrick was observed post Covid-19 immunization for 15 minutes without incidence. She was provided with Vaccine Information Sheet and instruction to access the V-Safe system.   Cathy Fitzpatrick was instructed to call 911 with any severe reactions post vaccine: Marland Kitchen Difficulty breathing  . Swelling of your face and throat  . A fast heartbeat  . A bad rash all over your body  . Dizziness and weakness    Immunizations Administered    Name Date Dose VIS Date Route   Pfizer COVID-19 Vaccine 11/07/2019 10:30 AM 0.3 mL 09/04/2019 Intramuscular   Manufacturer: [employer], [employer]   Lot: [id number]   NDC: [address]-0

## 2020-02-26 ENCOUNTER — Encounter: Payer: Self-pay | Admitting: Plastic Surgery

## 2020-02-26 ENCOUNTER — Ambulatory Visit (INDEPENDENT_AMBULATORY_CARE_PROVIDER_SITE_OTHER): Payer: Self-pay | Admitting: Plastic Surgery

## 2020-02-26 ENCOUNTER — Other Ambulatory Visit: Payer: Self-pay

## 2020-02-26 DIAGNOSIS — Z719 Counseling, unspecified: Secondary | ICD-10-CM

## 2020-02-26 NOTE — Progress Notes (Signed)
Patient ID: Cathy Fitzpatrick, female    DOB: [birth year], 74 y.o.   MRN: [id number]   Chief Complaint  Patient presents with  . Advice Only    for mini neck lift    The patient is a very sweet 74 year old female here for evaluation of her neck.  The patient has undergone a facelift in the past.  She has had laser and peels.  She has not had a lot of fillers.  She has had some Botox.  She overall looks very good especially for her age.  She has some concerns about her jowls and her neck.  On exam she has significant sun damage to her neck area.  Her face overall looks really good.  Her scars from her previous surgery are well-healed.  Her previous surgeries were done by Dr. Stephens November.   Review of Systems  Constitutional: Negative.   HENT: Negative.   Eyes: Negative.   Respiratory: Negative.   Cardiovascular: Negative.   Gastrointestinal: Negative.   Genitourinary: Negative.   Musculoskeletal: Negative.   Hematological: Negative.     Past Medical History:  Diagnosis Date  . Anxiety   . Arthritis   . GERD (gastroesophageal reflux disease)   . Hypothyroidism   . Macular degeneration, dry    right side, slight  . PONV (postoperative nausea and vomiting)     Past Surgical History:  Procedure Laterality Date  . BUNIONECTOMY Bilateral 2014  . KNEE ARTHROSCOPY Left 17 years ago   "cleaned it out and aligned patella"  . MENISCUS REPAIR Left 17 years ago  . TOTAL KNEE ARTHROPLASTY Left 11/02/2013   Procedure: LEFT TOTAL KNEE ARTHROPLASTY;  Surgeon: Loanne Drilling, MD;  Location: [facility];  Service: Orthopedics;  Laterality: Left;  . TUBAL LIGATION  40 years ago      Current Outpatient Medications:  .  amitriptyline (ELAVIL) 25 MG tablet, Take 25 mg by mouth at bedtime.  , Disp: , Rfl:  .  FLUoxetine (PROZAC) 20 MG tablet, Take 20 mg by mouth every morning. , Disp: , Rfl:  .  HYDROcodone-acetaminophen (NORCO/VICODIN) 5-325 MG per tablet, Take 1-2 tablets by mouth every 4 (four)  hours as needed for moderate pain., Disp: 90 tablet, Rfl: 0 .  levothyroxine (SYNTHROID, LEVOTHROID) 25 MCG tablet, Take 25 mcg by mouth daily before breakfast. , Disp: , Rfl:  .  methocarbamol (ROBAXIN) 500 MG tablet, Take 1 tablet (500 mg total) by mouth every 6 (six) hours as needed for muscle spasms., Disp: 80 tablet, Rfl: 0 .  neomycin-polymyxin-hydrocortisone (CORTISPORIN) 3.5-10000-1 otic suspension, Place 4 drops into the right ear 4 (four) times daily. X 7 days, Disp: 10 mL, Rfl: 0 .  oxyCODONE (OXY IR/ROXICODONE) 5 MG immediate release tablet, Take 1-2 tablets (5-10 mg total) by mouth every 3 (three) hours as needed for moderate pain or severe pain., Disp: 80 tablet, Rfl: 0 .  rivaroxaban (XARELTO) 10 MG TABS tablet, Take 1 tablet (10 mg total) by mouth daily with breakfast. Take Xarelto for two and a half more weeks, then discontinue Xarelto. Once the patient has completed the blood thinner regimen, then take a Baby 81 mg Aspirin daily for four more weeks., Disp: 19 tablet, Rfl: 0   Objective:   Vitals:   02/26/20 1205  BP: (!) 153/83  Pulse: 85  Temp: 98.2 F (36.8 C)  SpO2: 97%    Physical Exam Vitals and nursing note reviewed.  Constitutional:      Appearance: Normal appearance.  HENT:     Head: Normocephalic and atraumatic.  Cardiovascular:     Rate and Rhythm: Normal rate.     Pulses: Normal pulses.  Pulmonary:     Effort: Pulmonary effort is normal.  Neurological:     General: No focal deficit present.     Mental Status: She is alert and oriented to person, place, and time.  Psychiatric:        Mood and Affect: Mood normal.        Behavior: Behavior normal.     Assessment & Plan:  Encounter for counseling  We discussed options which include minimally invasive procedures such as laser and skin care.  I think she would do very well with skin creams from the [employer].  Also recommend the IPL laser.  I think it would be good to get her ready for possible  microneedling.  The patient is in agreement.  [place]ulah [place], DO

## 2020-04-26 ENCOUNTER — Encounter: Payer: Self-pay | Admitting: Plastic Surgery

## 2020-04-26 ENCOUNTER — Other Ambulatory Visit: Payer: Self-pay

## 2020-04-26 ENCOUNTER — Ambulatory Visit (INDEPENDENT_AMBULATORY_CARE_PROVIDER_SITE_OTHER): Payer: Self-pay | Admitting: Plastic Surgery

## 2020-04-26 VITALS — BP 170/83 | HR 75

## 2020-04-26 DIAGNOSIS — Z719 Counseling, unspecified: Secondary | ICD-10-CM

## 2020-04-26 NOTE — Progress Notes (Signed)
Preoperative Dx: hyperpigmentation of face  Postoperative Dx:  same  Procedure: laser to face   Anesthesia: none  Description of Procedure:  Risks and complications were explained to the patient. Consent was confirmed and signed. Time out was called and all information was confirmed to be correct. The area  area was prepped with alcohol and wiped dry. The IPL laser was set at 7.2 J/cm2. The face and neck were lasered. The patient tolerated the procedure well and there were no complications. The patient is to follow up in 4 weeks.   Filler Injection Procedure Note  Procedure:  Filler administration  Date of procedure: @DATE @  Pre-operative Diagnosis: Rytides and midface volume loss  Post-operative Diagnosis: Same  Surgeon: Electronically signed by: , DO   Complications:  None  Brief history: The patient desires injection with fillers in her face. I discussed with the patient this proposed procedure of filler injections, which is customized depending on the particular needs of the patient. It is performed on facial volume loss as a temporary correction. The alternatives were discussed with the patient. The risks were addressed including bleeding, scarring, infection, damage to deeper structures, asymmetry, and chronic pain, which may occur infrequently after a procedure. The individual's choice to undergo a surgical procedure is based on the comparison of risks to potential benefits. Other risks include unsatisfactory results, allergic reaction, which should go away with time, bruising and delayed healing. Fillers do not arrest the aging process or produce permanent tightening.  Operative intervention maybe necessary to maintain the results. The patient understands and wishes to proceed. An informed consent was signed and informational brochures given to her prior to the procedure.  Procedure: The area was prepped with chlorhexidine and dried with a clean gauze. Using a clean  technique, a 30 gauge needle was then used to inject the midface area. This was done at the medial sub-region of the mid-face.  No complications were noted. Light pressure was held for 5 minutes. She was instructed explicitly in post-operative care.  Voluma XC 1 ml x 2 LOT: Wayland Denis EXP: 2021-04-25

## 2020-05-20 ENCOUNTER — Other Ambulatory Visit: Payer: Medicare Other

## 2020-06-16 ENCOUNTER — Other Ambulatory Visit: Payer: Medicare Other

## 2020-06-30 ENCOUNTER — Other Ambulatory Visit: Payer: Self-pay

## 2020-06-30 ENCOUNTER — Ambulatory Visit (INDEPENDENT_AMBULATORY_CARE_PROVIDER_SITE_OTHER): Payer: Self-pay

## 2020-06-30 VITALS — BP 120/80 | HR 78 | Temp 98.1°F | Ht 64.0 in | Wt 154.0 lb

## 2020-06-30 DIAGNOSIS — Z719 Counseling, unspecified: Secondary | ICD-10-CM

## 2020-07-12 NOTE — Patient Instructions (Signed)
Pt will use sunscreen/moisturize face Avoid retina/harsh scrubs for approx 2 weeks Call for any concerns F/U in 4 weeks for next laser TX

## 2020-07-12 NOTE — Progress Notes (Signed)
Preoperative Dx: facial hyperpigmentation  Postoperative Dx:  same  Procedure: laser to face  Anesthesia: none  Description of Procedure:  Risks and complications were explained to the patient. Consent was confirmed and signed. Time out was called and all information was confirmed to be correct. The area was prepped with alcohol and wiped dry.  The IPL laser was set at the following:  skin -2/ suntan light -  7.0 then decreased to 6.0 mouth area. The entire face was lasered The patient tolerated the procedure well and there were no complications.  Aloe applied. She is reminded to protect skin with sunscreen & moisturizer & avoid retina products & no abrasive scrubs for approx.  10 to 14 days  She is reminded that she may experience redness/peeling & irritation F/U 4 weeks   She will call for any concerns [facility]

## 2020-08-29 ENCOUNTER — Other Ambulatory Visit: Payer: Self-pay

## 2020-08-29 ENCOUNTER — Ambulatory Visit (INDEPENDENT_AMBULATORY_CARE_PROVIDER_SITE_OTHER): Payer: Self-pay

## 2020-08-29 VITALS — BP 130/88 | HR 86 | Temp 96.8°F | Ht 64.0 in | Wt 150.0 lb

## 2020-08-29 DIAGNOSIS — Z719 Counseling, unspecified: Secondary | ICD-10-CM

## 2020-09-26 NOTE — Progress Notes (Signed)
Preoperative Dx: facial aging  Postoperative Dx:  same  Procedure: laser to face  Anesthesia:  EMLA applied 30 mins  prior to procedure  Description of Procedure:  Risks and complications were explained to the patient. Consent was confirmed and signed. Time out was called and all information was confirmed to be correct. The area  was prepped with alcohol and wiped dry.  The FRAX laser was set at the following:  skin -2 / suntan light -  38/25% coverage with 4 passes  The entire face & upper neck was lasered The patient tolerated the procedure well and there were no complications.   Aloe & post laser balm applied after procedure She is reminded to protect skin with sunscreen & moisturizer & avoid retina products & no abrasive scrubs for approx. 10 days.  She is reminded that she may experience redness/peeling & irritation Patient to f/u in 6 weeks- she plans to have a 2nd FRAX laser procedure at her next appointment.    She will call for any concerns [place]

## 2020-09-26 NOTE — Patient Instructions (Signed)
Pt tolerated laser procedure well We applied aloe & post laser balm- & directed her to use the after laser kit-( given to pt) with the gentle cleanser & dermal repair cream 2x/day. Otherwise she will avoid any retinol products & harsh scrubs for 1 week or until skin is no longer red/irritated or peeling. She will call for any concerns or questions. F/u in 6 weeks for next FRAX laser if necessary.

## 2020-10-24 ENCOUNTER — Ambulatory Visit (INDEPENDENT_AMBULATORY_CARE_PROVIDER_SITE_OTHER): Payer: Self-pay

## 2020-10-24 ENCOUNTER — Other Ambulatory Visit: Payer: Self-pay

## 2020-10-24 VITALS — BP 132/78 | HR 82 | Temp 97.8°F | Ht 64.0 in | Wt 150.0 lb

## 2020-10-24 DIAGNOSIS — Z719 Counseling, unspecified: Secondary | ICD-10-CM

## 2020-10-24 NOTE — Progress Notes (Unsigned)
Preoperative Dx: facial aging  Postoperative Dx:  same  Procedure: laser to face  Anesthesia: EMLA -pt applied 30 mins prior to procedure  Description of Procedure: pre-procedure photos were obtained & entered into chart. We reviewed her previous photos & she states she can see a marked difference in her skin tone/texture. Risks and complications were explained to the patient. Consent was confirmed and signed- will be scanned into chart. Time out was called and all information was confirmed to be correct. The area  was prepped with alcohol and wiped dry.  The FRAX laser was set at the following:  skin -1/ suntan -none  = 4.0/25.0 & 3 passes Decreased to 36.0 around mouth/forehead & eye area The entire face was lasered The patient tolerated the procedure well and there were no complications.  Aloe gel mixed with after laser balm applied. She is reminded to protect skin with sunscreen & moisturizer & avoid retina products & no abrasive scrubs for approx. 7 to 10 days  She is reminded that she may experience redness/peeling & irritation Patient to f/u in 6 weeks.  she will call for any concerns.

## 2020-10-24 NOTE — Patient Instructions (Signed)
Pt will protect skin with sunscreen and moisturizer. She will avoid harsh scrubs/retinol products for approx 10 days. She understands that she may experience redness/dryness & peeling. She will call for any concerns

## 2021-01-19 ENCOUNTER — Other Ambulatory Visit: Payer: Medicare Other

## 2021-06-04 NOTE — H&P (Signed)
Preoperative History & Physical Exam  Surgeon: Philipp Ovens, MD  Diagnosis: left thumb UCL MP tear  Planned Procedure: Procedure(s) (LRB): Thumb metacarpophalangeal joint ulnar collateral ligament repair, possible reconstruction, internal brace (Left)  History of Present Illness:   Patient is a 75 y.o. female with symptoms consistent with  left thumb UCL MP tear who presents for surgical intervention. The risks, benefits and alternatives of surgical intervention were discussed and informed consent was obtained prior to surgery.  Past Medical History:  Past Medical History:  Diagnosis Date   Anxiety    Arthritis    GERD (gastroesophageal reflux disease)    Hypothyroidism    Macular degeneration, dry    right side, slight   PONV (postoperative nausea and vomiting)     Past Surgical History:  Past Surgical History:  Procedure Laterality Date   BUNIONECTOMY Bilateral 2014   KNEE ARTHROSCOPY Left 17 years ago   "cleaned it out and aligned patella"   MENISCUS REPAIR Left 17 years ago   TOTAL KNEE ARTHROPLASTY Left 11/02/2013   Procedure: LEFT TOTAL KNEE ARTHROPLASTY;  Surgeon: Loanne Drilling, MD;  Location: [facility];  Service: Orthopedics;  Laterality: Left;   TUBAL LIGATION  40 years ago    Medications:  Prior to Admission medications   Medication Sig Start Date End Date Taking? Authorizing Provider  amitriptyline (ELAVIL) 25 MG tablet Take 25 mg by mouth at bedtime.      [provider]  FLUoxetine (PROZAC) 20 MG tablet Take 20 mg by mouth every morning.     [provider]  HYDROcodone-acetaminophen (NORCO/VICODIN) 5-325 MG per tablet Take 1-2 tablets by mouth every 4 (four) hours as needed for moderate pain. 11/04/13   Perkins, Alexzandrew L, PA-C  levothyroxine (SYNTHROID, LEVOTHROID) 25 MCG tablet Take 25 mcg by mouth daily before breakfast.     [provider]  methocarbamol (ROBAXIN) 500 MG tablet Take 1 tablet (500 mg total) by mouth every 6  (six) hours as needed for muscle spasms. 11/04/13   Perkins, Alexzandrew L, PA-C  neomycin-polymyxin-hydrocortisone (CORTISPORIN) 3.5-10000-1 otic suspension Place 4 drops into the right ear 4 (four) times daily. X 7 days 11/10/14   Elwin Mocha, MD  oxyCODONE (OXY IR/ROXICODONE) 5 MG immediate release tablet Take 1-2 tablets (5-10 mg total) by mouth every 3 (three) hours as needed for moderate pain or severe pain. 11/04/13   Perkins, Alexzandrew L, PA-C  rivaroxaban (XARELTO) 10 MG TABS tablet Take 1 tablet (10 mg total) by mouth daily with breakfast. Take Xarelto for two and a half more weeks, then discontinue Xarelto. Once the patient has completed the blood thinner regimen, then take a Baby 81 mg Aspirin daily for four more weeks. 11/04/13   Perkins, Alexzandrew L, PA-C    Allergies:  Codeine  Review of Systems: Negative except per HPI.  Physical Exam: Alert and oriented, NAD Head and neck: no masses, normal alignment CV: pulse intact Pulm: no increased work of breathing, respirations even and unlabored Abdomen: non-distended Extremities: extremities warm and well perfused  LABS: No results found for this or any previous visit (from the past 2160 hour(s)).   Complete History and Physical exam available in the office notes  Gomez Cleverly

## 2021-06-12 ENCOUNTER — Other Ambulatory Visit: Payer: Self-pay

## 2021-06-12 ENCOUNTER — Encounter (HOSPITAL_BASED_OUTPATIENT_CLINIC_OR_DEPARTMENT_OTHER): Payer: Self-pay | Admitting: Orthopedic Surgery

## 2021-06-12 NOTE — Progress Notes (Signed)
06/12/2021 11:38 AM Spoke w/ via phone for pre-op interview---patient Lab needs dos----              ISTAT, EKG Lab results---na--- COVID test -----patient states asymptomatic no test needed Arrive at -------0700 NPO after MN NO Solid Food.  Clear liquids from MN until---0600 Med rec completed  Medications to take morning of surgery -----Elavil, Vitamin D, Cosopt, Prozac, Synthroid  Diabetic medication -----na Patient instructed no nail polish to be worn day of surgery Patient instructed to bring photo id and insurance card day of surgery Patient aware to have Driver (ride ) / caregiver    for 24 hours after surgery  Patient Special Instructions -----na Pre-Op special Istructions -----na Patient verbalized understanding of instructions that were given at this phone interview. Patient denies shortness of breath, chest pain, fever, cough at this phone interview.

## 2021-06-14 NOTE — Anesthesia Preprocedure Evaluation (Addendum)
Anesthesia Evaluation  Patient identified by MRN, date of birth, ID band  Reviewed: Allergy & Precautions, NPO status , Patient's Chart, lab work & pertinent test results  Airway Mallampati: II  TM Distance: >3 FB Neck ROM: Full    Dental no notable dental hx. (+) Teeth Intact, Dental Advisory Given   Pulmonary    Pulmonary exam normal breath sounds clear to auscultation       Cardiovascular hypertension, Normal cardiovascular exam Rhythm:Regular Rate:Normal     Neuro/Psych Anxiety    GI/Hepatic GERD  ,  Endo/Other  Hypothyroidism   Renal/GU      Musculoskeletal  (+) Arthritis ,   Abdominal   Peds  Hematology   Anesthesia Other Findings   Reproductive/Obstetrics                            Anesthesia Physical Anesthesia Plan  ASA: 2  Anesthesia Plan: MAC and Regional   Post-op Pain Management:    Induction:   PONV Risk Score and Plan: 3 and Treatment may vary due to age or medical condition, Midazolam and Ondansetron  Airway Management Planned: Natural Airway and Nasal Cannula  Additional Equipment: None  Intra-op Plan:   Post-operative Plan:   Informed Consent: I have reviewed the patients History and Physical, chart, labs and discussed the procedure including the risks, benefits and alternatives for the proposed anesthesia with the patient or authorized representative who has indicated his/her understanding and acceptance.     Dental advisory given  Plan Discussed with: CRNA and Anesthesiologist  Anesthesia Plan Comments: (MAC w L supraclavicular block)      Anesthesia Quick Evaluation

## 2021-06-15 ENCOUNTER — Ambulatory Visit (HOSPITAL_BASED_OUTPATIENT_CLINIC_OR_DEPARTMENT_OTHER)
Admission: RE | Admit: 2021-06-15 | Discharge: 2021-06-15 | Disposition: A | Discharge status: Home / Self Care | Payer: Medicare Other | Attending: Orthopedic Surgery | Admitting: Orthopedic Surgery

## 2021-06-15 ENCOUNTER — Ambulatory Visit (HOSPITAL_BASED_OUTPATIENT_CLINIC_OR_DEPARTMENT_OTHER): Payer: Medicare Other | Admitting: Anesthesiology

## 2021-06-15 ENCOUNTER — Encounter (HOSPITAL_BASED_OUTPATIENT_CLINIC_OR_DEPARTMENT_OTHER)
Admission: RE | Disposition: A | Discharge status: Home / Self Care | Payer: Self-pay | Source: Home / Self Care | Attending: Orthopedic Surgery

## 2021-06-15 ENCOUNTER — Encounter (HOSPITAL_BASED_OUTPATIENT_CLINIC_OR_DEPARTMENT_OTHER): Payer: Self-pay | Admitting: Orthopedic Surgery

## 2021-06-15 DIAGNOSIS — S63642A Sprain of metacarpophalangeal joint of left thumb, initial encounter: Secondary | ICD-10-CM | POA: Insufficient documentation

## 2021-06-15 DIAGNOSIS — Z79890 Hormone replacement therapy (postmenopausal): Secondary | ICD-10-CM | POA: Diagnosis not present

## 2021-06-15 DIAGNOSIS — Z885 Allergy status to narcotic agent status: Secondary | ICD-10-CM | POA: Insufficient documentation

## 2021-06-15 DIAGNOSIS — Z79899 Other long term (current) drug therapy: Secondary | ICD-10-CM | POA: Diagnosis not present

## 2021-06-15 DIAGNOSIS — X58XXXA Exposure to other specified factors, initial encounter: Secondary | ICD-10-CM | POA: Diagnosis not present

## 2021-06-15 HISTORY — DX: Essential (primary) hypertension: I10

## 2021-06-15 HISTORY — PX: ULNAR COLLATERAL LIGAMENT REPAIR: SHX6159

## 2021-06-15 LAB — POCT I-STAT, CHEM 8
BUN: 10 mg/dL (ref 8–23)
Calcium, Ion: 1.31 mmol/L (ref 1.15–1.40)
Chloride: 99 mmol/L (ref 98–111)
Creatinine, Ser: 0.6 mg/dL (ref 0.44–1.00)
Glucose, Bld: 85 mg/dL (ref 70–99)
HCT: 38 % (ref 36.0–46.0)
Hemoglobin: 12.9 g/dL (ref 12.0–15.0)
Potassium: 3.7 mmol/L (ref 3.5–5.1)
Sodium: 138 mmol/L (ref 135–145)
TCO2: 27 mmol/L (ref 22–32)

## 2021-06-15 SURGERY — REPAIR, LIGAMENT, ULNAR COLLATERAL
Anesthesia: Monitor Anesthesia Care | Site: Hand | Laterality: Left

## 2021-06-15 MED ORDER — ONDANSETRON HCL 4 MG/2ML IJ SOLN
INTRAMUSCULAR | Status: AC
  Filled 2021-06-15: qty 2

## 2021-06-15 MED ORDER — 0.9 % SODIUM CHLORIDE (POUR BTL) OPTIME
TOPICAL | Status: DC | PRN
  Administered 2021-06-15: 500 mL

## 2021-06-15 MED ORDER — CLONIDINE HCL (ANALGESIA) 100 MCG/ML EP SOLN
EPIDURAL | Status: DC | PRN
  Administered 2021-06-15: 100 ug

## 2021-06-15 MED ORDER — CEFAZOLIN SODIUM-DEXTROSE 2-4 GM/100ML-% IV SOLN
INTRAVENOUS | Status: AC
  Filled 2021-06-15: qty 100

## 2021-06-15 MED ORDER — FENTANYL CITRATE (PF) 100 MCG/2ML IJ SOLN
INTRAMUSCULAR | Status: AC
  Filled 2021-06-15: qty 2

## 2021-06-15 MED ORDER — FENTANYL CITRATE (PF) 100 MCG/2ML IJ SOLN
INTRAMUSCULAR | Status: DC | PRN
  Administered 2021-06-15 (×2): 25 ug via INTRAVENOUS

## 2021-06-15 MED ORDER — ROPIVACAINE HCL 7.5 MG/ML IJ SOLN
INTRAMUSCULAR | Status: DC | PRN
  Administered 2021-06-15: 20 mL via PERINEURAL

## 2021-06-15 MED ORDER — MIDAZOLAM HCL 2 MG/2ML IJ SOLN
2.0000 mg | Freq: Once | INTRAMUSCULAR | Status: AC
  Administered 2021-06-15: 2 mg via INTRAVENOUS

## 2021-06-15 MED ORDER — LACTATED RINGERS IV SOLN: INTRAVENOUS | Status: DC

## 2021-06-15 MED ORDER — MIDAZOLAM HCL 2 MG/2ML IJ SOLN
INTRAMUSCULAR | Status: AC
  Filled 2021-06-15: qty 2

## 2021-06-15 MED ORDER — PROPOFOL 10 MG/ML IV BOLUS
INTRAVENOUS | Status: DC | PRN
  Administered 2021-06-15 (×3): 20 mg via INTRAVENOUS

## 2021-06-15 MED ORDER — CEFAZOLIN SODIUM-DEXTROSE 2-4 GM/100ML-% IV SOLN
2.0000 g | INTRAVENOUS | Status: AC
  Administered 2021-06-15: 2 g via INTRAVENOUS

## 2021-06-15 MED ORDER — ONDANSETRON HCL 4 MG/2ML IJ SOLN
INTRAMUSCULAR | Status: DC | PRN
  Administered 2021-06-15: 4 mg via INTRAVENOUS

## 2021-06-15 MED ORDER — LIDOCAINE HCL 1 % IJ SOLN
INTRAMUSCULAR | Status: DC | PRN
  Administered 2021-06-15: 1.5 mL

## 2021-06-15 MED ORDER — PROPOFOL 500 MG/50ML IV EMUL
INTRAVENOUS | Status: AC
  Filled 2021-06-15: qty 50

## 2021-06-15 MED ORDER — BUPIVACAINE HCL (PF) 0.5 % IJ SOLN
INTRAMUSCULAR | Status: DC | PRN
  Administered 2021-06-15: 1.5 mL

## 2021-06-15 SURGICAL SUPPLY — 46 items
ADH SKN CLS APL DERMABOND .7 (GAUZE/BANDAGES/DRESSINGS) ×1
ANCHOR REPAIR HAND WRIST (Orthopedic Implant) ×2 IMPLANT
APL SKNCLS STERI-STRIP NONHPOA (GAUZE/BANDAGES/DRESSINGS) ×1
BENZOIN TINCTURE PRP APPL 2/3 (GAUZE/BANDAGES/DRESSINGS) ×2 IMPLANT
BIT DRILL 3 CANN STRGHT (BIT) ×2
BLADE SURG 15 STRL LF DISP TIS (BLADE) ×2 IMPLANT
BLADE SURG 15 STRL SS (BLADE) ×4
BNDG CMPR 9X4 STRL LF SNTH (GAUZE/BANDAGES/DRESSINGS) ×1
BNDG ELASTIC 4X5.8 VLCR STR LF (GAUZE/BANDAGES/DRESSINGS) ×2 IMPLANT
BNDG ESMARK 4X9 LF (GAUZE/BANDAGES/DRESSINGS) ×2 IMPLANT
CORD BIPOLAR FORCEPS 12FT (ELECTRODE) ×2 IMPLANT
COVER BACK TABLE 60X90IN (DRAPES) ×2 IMPLANT
DECANTER SPIKE VIAL GLASS SM (MISCELLANEOUS) IMPLANT
DERMABOND ADVANCED (GAUZE/BANDAGES/DRESSINGS) ×1
DERMABOND ADVANCED .7 DNX12 (GAUZE/BANDAGES/DRESSINGS) ×1 IMPLANT
DRAPE EXTREMITY T 121X128X90 (DISPOSABLE) ×2 IMPLANT
DRAPE OEC MINIVIEW 54X84 (DRAPES) ×2 IMPLANT
DRAPE SHEET LG 3/4 BI-LAMINATE (DRAPES) ×2 IMPLANT
DRAPE SURG 17X23 STRL (DRAPES) ×2 IMPLANT
DRILL COUNTERSINK CANN 3 (BIT) ×1 IMPLANT
ELECT REM PT RETURN 15FT ADLT (MISCELLANEOUS) ×2 IMPLANT
GAUZE 4X4 16PLY ~~LOC~~+RFID DBL (SPONGE) ×2 IMPLANT
GAUZE SPONGE 4X4 12PLY STRL (GAUZE/BANDAGES/DRESSINGS) ×2 IMPLANT
GLOVE SURG UNDER POLY LF SZ7.5 (GLOVE) ×4 IMPLANT
GOWN STRL REUS W/TWL LRG LVL3 (GOWN DISPOSABLE) ×2 IMPLANT
Implant system, hand/wrist internalBrace ligament ×2 IMPLANT
NEEDLE HYPO 25X1 1.5 SAFETY (NEEDLE) ×2 IMPLANT
NS IRRIG 1000ML POUR BTL (IV SOLUTION) ×2 IMPLANT
PACK BASIN DAY SURGERY FS (CUSTOM PROCEDURE TRAY) ×2 IMPLANT
PAD CAST 4YDX4 CTTN HI CHSV (CAST SUPPLIES) ×1 IMPLANT
PADDING CAST ABS 4INX4YD NS (CAST SUPPLIES) ×1
PADDING CAST ABS COTTON 4X4 ST (CAST SUPPLIES) ×1 IMPLANT
PADDING CAST COTTON 4X4 STRL (CAST SUPPLIES) ×2
SLEEVE SCD COMPRESS KNEE MED (STOCKING) IMPLANT
SLING ARM FOAM STRAP MED (SOFTGOODS) ×2 IMPLANT
STRIP CLOSURE SKIN 1/2X4 (GAUZE/BANDAGES/DRESSINGS) ×2 IMPLANT
SUCTION FRAZIER HANDLE 10FR (MISCELLANEOUS) ×1
SUCTION TUBE FRAZIER 10FR DISP (MISCELLANEOUS) ×1 IMPLANT
SUT FIBERWIRE 2-0 18 17.9 3/8 (SUTURE) ×2
SUT MNCRL AB 4-0 PS2 18 (SUTURE) ×2 IMPLANT
SUTURE FIBERWR 2-0 18 17.9 3/8 (SUTURE) ×1 IMPLANT
SYR 10ML LL (SYRINGE) ×2 IMPLANT
SYR BULB EAR ULCER 2OZ BL STRL (SYRINGE) ×2 IMPLANT
SYR CONTROL 10ML LL (SYRINGE) ×2 IMPLANT
TOWEL OR 17X26 10 PK STRL BLUE (TOWEL DISPOSABLE) ×4 IMPLANT
TRAY DSU PREP LF (CUSTOM PROCEDURE TRAY) ×2 IMPLANT

## 2021-06-15 NOTE — Anesthesia Procedure Notes (Addendum)
Anesthesia Regional Block: Supraclavicular block   Pre-Anesthetic Checklist: , timeout performed,  Correct Patient, Correct Site, Correct Laterality,  Correct Procedure, Correct Position, site marked,  Risks and benefits discussed,  Surgical consent,  Pre-op evaluation,  At surgeon's request and post-op pain management  Laterality: Left and Upper  Prep: chloraprep       Needles:  Injection technique: Single-shot  Needle Type: Echogenic Needle     Needle Length: 5cm  Needle Gauge: 21     Additional Needles:   Procedures:,,,, ultrasound used (permanent image in chart),,    Narrative:  Start time: 06/15/2021 8:16 AM End time: 06/15/2021 8:21 AM Injection made incrementally with aspirations every 5 mL.  Performed by: Personally  Anesthesiologist: Trevor Iha, MD

## 2021-06-15 NOTE — Anesthesia Procedure Notes (Signed)
Procedure Name: MAC Date/Time: 06/15/2021 8:30 AM Performed by: Justice Rocher, CRNA Pre-anesthesia Checklist: Timeout performed, Patient being monitored, Suction available, Emergency Drugs available and Patient identified Patient Re-evaluated:Patient Re-evaluated prior to induction Oxygen Delivery Method: Simple face mask Preoxygenation: Pre-oxygenation with 100% oxygen Induction Type: IV induction Placement Confirmation: breath sounds checked- equal and bilateral, CO2 detector and positive ETCO2

## 2021-06-15 NOTE — Addendum Note (Signed)
Addendum  created 06/15/21 1337 by Jessica Priest, CRNA   Charge Capture section accepted

## 2021-06-15 NOTE — Transfer of Care (Signed)
Immediate Anesthesia Transfer of Care Note  Patient: Cathy Fitzpatrick  Procedure(s) Performed: Procedure(s) (LRB): Thumb metacarpophalangeal joint ulnar collateral ligament repair, possible reconstruction, internal brace (Left)  Patient Location: PACU  Anesthesia Type: General  Level of Consciousness: awake, sedated, patient cooperative and responds to stimulation  Airway & Oxygen Therapy: Patient Spontanous Breathing and Patient connected to RA and soft FM  Post-op Assessment: Report given to PACU RN, Post -op Vital signs reviewed and stable and Patient moving all extremities  Post vital signs: Reviewed and stable  Complications: No apparent anesthesia complications

## 2021-06-15 NOTE — Discharge Instructions (Addendum)
Orthopaedic Hand Surgery Discharge Instructions  WEIGHT BEARING STATUS: Non weight bearing on operative extremity  DRESSINGS: Please keep your dressing/splint/cast clean and dry until your follow-up appointment. You may shower by placing a waterproof covering over your dressing/splint/cast. Contact your surgeon if your splint/cast gets wet. It will need to be changed to prevent skin breakdown.  PAIN CONTROL: First line medications for post operative pain control are Tylenol (acetaminophen) and Motrin (ibuprofen) if you are able to take these medications. If you have been prescribed a medication these can be taken as breakthrough pain medications. Please note that some narcotic pain medication have acetaminophen added and you should never consume more than 4,000mg  of acetaminophen in 24 hour period. Also please note that if you are given Toradol (ketoralac) you should not take similar medications simultaneously such as ibuprofen.   ICE/ELEVATION: Ice and elevate your injured extremity as needed. Avoid direct contact of ice with skin.  HOME MEDICATIONS: No changes have been made to your home medications.  FOLLOW UP: You will be called after surgery with an appointment date and time, however if you have not received a phone call within 3 days please call during regular office hours at [phone] to schedule a post operative appointment.  Please Seek Medical Attention if: Call MD for: pain or pressure in chest, jaw, arm, back, neck  Call MD for: temperature greater than 101 F for more than 24 hours  Call MD for: difficulty breathing Call MD for: Incision redness, bleeding, drainage  Call MD for: palpitations or feeling that the heart is racing  Call MD for: increased swelling in arm, leg, ankle, or abdomen  Call MD for: lightheadedness, dizziness, fainting Go to ED or call 911 if: chest pain does not go away after 3 nitroglycerin doses taken 5 min apart  Go to ED or call 911 for: any  uncontrolled bleeding  Go to ED or call 911 if: unable to reach physician  Discharge Medications: Percocet 5mg /325mg  #20 for postop pain control- sent through [facility].    , MD Orthopaedic Hand Surgery  Regional Anesthesia Blocks  1. Numbness or the inability to move the "blocked" extremity may last from 3-48 hours after placement. The length of time depends on the medication injected and your individual response to the medication. If the numbness is not going away after 48 hours, call your surgeon.  2. The extremity that is blocked will need to be protected until the numbness is gone and the  Strength has returned. Because you cannot feel it, you will need to take extra care to avoid injury. Because it may be weak, you may have difficulty moving it or using it. You may not know what position it is in without looking at it while the block is in effect.  3. For blocks in the legs and feet, returning to weight bearing and walking needs to be done carefully. You will need to wait until the numbness is entirely gone and the strength has returned. You should be able to move your leg and foot normally before you try and bear weight or walk. You will need someone to be with you when you first try to ensure you do not fall and possibly risk injury.  4. Bruising and tenderness at the needle site are common side effects and will resolve in a few days.  5. Persistent numbness or new problems with movement should be communicated to the surgeon or the [facility] [phone] ([phone])/  Surgery Center [phone]).   Post Anesthesia Home Care Instructions  Activity: Get plenty of rest for the remainder of the day. A responsible individual must stay with you for 24 hours following the procedure.  For the next 24 hours, DO NOT: -Drive a car -Advertising copywriter -Drink alcoholic beverages -Take any medication unless instructed by your physician -Make any legal  decisions or sign important papers.  Meals: Start with liquid foods such as gelatin or soup. Progress to regular foods as tolerated. Avoid greasy, spicy, heavy foods. If nausea and/or vomiting occur, drink only clear liquids until the nausea and/or vomiting subsides. Call your physician if vomiting continues.  Special Instructions/Symptoms: Your throat may feel dry or sore from the anesthesia or the breathing tube placed in your throat during surgery. If this causes discomfort, gargle with warm salt water. The discomfort should disappear within 24 hours.  If you had a scopolamine patch placed behind your ear for the management of post- operative nausea and/or vomiting:  1. The medication in the patch is effective for 72 hours, after which it should be removed.  Wrap patch in a tissue and discard in the trash. Wash hands thoroughly with soap and water. 2. You may remove the patch earlier than 72 hours if you experience unpleasant side effects which may include dry mouth, dizziness or visual disturbances. 3. Avoid touching the patch. Wash your hands with soap and water after contact with the patch.

## 2021-06-15 NOTE — Anesthesia Postprocedure Evaluation (Signed)
Anesthesia Post Note  Patient: Cathy Fitzpatrick  Procedure(s) Performed: Thumb metacarpophalangeal joint ulnar collateral ligament repair, possible reconstruction, internal brace (Left: Hand)     Patient location during evaluation: PACU Anesthesia Type: MAC and Regional Level of consciousness: awake and alert Pain management: pain level controlled Vital Signs Assessment: post-procedure vital signs reviewed and stable Respiratory status: spontaneous breathing, nonlabored ventilation, respiratory function stable and patient connected to nasal cannula oxygen Cardiovascular status: stable and blood pressure returned to baseline Postop Assessment: no apparent nausea or vomiting Anesthetic complications: no   No notable events documented.  Last Vitals:  Vitals:   06/15/21 1015 06/15/21 1030  BP: 135/62 (!) 158/99  Pulse: 69 72  Resp: 15 16  Temp:  (!) 36.4 C  SpO2: 94% 96%    Last Pain:  Vitals:   06/15/21 1030  TempSrc:   PainSc: 0-No pain                 Barnet Glasgow

## 2021-06-15 NOTE — Progress Notes (Signed)
Assisted Dr. Houser with left, ultrasound guided, supraclavicular block. Side rails up, monitors on throughout procedure. See vital signs in flow sheet. Tolerated Procedure well. °

## 2021-06-15 NOTE — Interval H&P Note (Signed)
History and Physical Interval Note:  06/15/2021 7:51 AM  Cathy Fitzpatrick  has presented today for surgery, with the diagnosis of left thumb UCL MP tear.  The various methods of treatment have been discussed with the patient and family. After consideration of risks, benefits and other options for treatment, the patient has consented to  Procedure(s) with comments: Thumb metacarpophalangeal joint ulnar collateral ligament repair, possible reconstruction, internal brace (Left) - with regional block as a surgical intervention.  The patient's history has been reviewed, patient examined, no change in status, stable for surgery.  I have reviewed the patient's chart and labs.  Questions were answered to the patient's satisfaction.     Gomez Cleverly

## 2021-06-15 NOTE — Op Note (Addendum)
OPERATIVE NOTE  DATE OF PROCEDURE: 06/15/2021  SURGEONS:  Primary: Orene Desanctis, MD  PREOPERATIVE DIAGNOSIS: left thumb ulnar collateral ligament of metacarpophalangeal joint tear with stener lesion  POSTOPERATIVE DIAGNOSIS: Same  NAME OF PROCEDURE: Left thumb metacarpophalangeal joint ulnar collateral ligament repair with internal brace  ANESTHESIA: Monitor Anesthesia Care + regional block  SKIN PREPARATION: Hibiclens  ESTIMATED BLOOD LOSS: Minimal  IMPLANTS: arthrex 3.5 anchors x 2 with fiberwire and labral tape for internal brace  Implant Name Type Inv. Item Serial No. Manufacturer Lot No. LRB No. Used Action  KIT HAND WRIST LIGAMENT AUG - [id number] Orthopedic Implant KIT HAND WRIST LIGAMENT AUG  Rolinda Roan [id number] Left 1 Implanted    INDICATIONS:  Cathy Fitzpatrick is a 75 y.o. female who has the above preoperative diagnosis. The patient has decided to proceed with surgical intervention.  Risks, benefits and alternatives of operative management were discussed including, but not limited to, risks of anesthesia complications, infection, pain, persistent symptoms, stiffness, need for future surgery.  The patient understands, agrees and elects to proceed with surgery.    DESCRIPTION OF PROCEDURE: The patient was met in the pre-operative area and their identity was verified.  The operative location and laterality was also verified and marked.  The patient was brought to the OR and was placed supine on the table.  After repeat patient identification with the operative team anesthesia was provided and the patient was prepped and draped in the usual sterile fashion.  A final timeout was performed verifying the correction patient, procedure, location and laterality.  The left upper extremity was exsanguinated and tourniquet inflated to 248mHg. A longitudinal incision was made over the ulnar aspect of the metacarpophalangeal joint of the left thumb at the location of the torn ulnar collateral ligament.   Skin is Cutaneous tissues were divided.  The branch of the radial sensory nerve was mobilized and protected throughout the case.  The adductor aponeurosis  was incised longitudinally and the collateral ligament was identified which was flipped proximally to the structure as a stener lesion.  The ulnar collateral ligament was mobilized and the location for the 2 anchors one of the proximal phalanx base the volar aspect and 1 in the dorsal aspect of the thumb metacarpal head were identified in the isometric point was identified.  The ulnar collateral ligament was offloaded and flexed to 30 degrees and the suture anchor was drilled and placed in the proximal phalanx followed by the internal brace being secured back over the ulnar collateral ligament into the metacarpal head.  There is excellent stability restored and a good firm endpoint at the end of the case.  The wound was thoroughly irrigated and closure was performed with 3-0 Vicryl suture to close the dorsal capsule as well as the adductor aponeurosis  in separate layers followed by skin closure with Monocryl, Dermabond and Steri-Strips.  A sterile soft dressing was performed followed by a plaster thumb spica splint with the metacarpophalangeal joint offloaded with no radial deviation of the thumb.  In the of the case all counts were correct x2.  Patient tolerated the procedure well.  The fingers were pink and warm and well-perfused at the end the case.  Postoperative plan follow-up in 2 weeks for wound check transition to a short arm thumb spica cast for 2 weeks followed by occupational therapy for ulnar collateral ligament repair protocol.   JMatt Holmes MD

## 2021-06-16 ENCOUNTER — Encounter (HOSPITAL_BASED_OUTPATIENT_CLINIC_OR_DEPARTMENT_OTHER): Payer: Self-pay | Admitting: Orthopedic Surgery

## 2021-06-16 NOTE — Addendum Note (Signed)
Addendum  created 06/16/21 0657 by Bishop Limbo, CRNA   Charge Capture section accepted

## 2023-08-07 ENCOUNTER — Encounter: Payer: Self-pay | Admitting: Podiatry

## 2023-08-07 ENCOUNTER — Ambulatory Visit (INDEPENDENT_AMBULATORY_CARE_PROVIDER_SITE_OTHER): Payer: Medicare Other

## 2023-08-07 ENCOUNTER — Ambulatory Visit (INDEPENDENT_AMBULATORY_CARE_PROVIDER_SITE_OTHER): Payer: Medicare Other | Admitting: Podiatry

## 2023-08-07 DIAGNOSIS — M2042 Other hammer toe(s) (acquired), left foot: Secondary | ICD-10-CM | POA: Diagnosis not present

## 2023-08-07 DIAGNOSIS — M79671 Pain in right foot: Secondary | ICD-10-CM | POA: Diagnosis not present

## 2023-08-07 DIAGNOSIS — D2372 Other benign neoplasm of skin of left lower limb, including hip: Secondary | ICD-10-CM

## 2023-08-07 DIAGNOSIS — M79672 Pain in left foot: Secondary | ICD-10-CM | POA: Diagnosis not present

## 2023-08-07 NOTE — Progress Notes (Signed)
  Subjective:  Patient ID: Cathy Fitzpatrick, female    DOB: 01/04/1946,   MRN: [id number]  No chief complaint on file.   77 y.o. female presents for concern of lesions on the bottom of her left foot. Relates a history of lesions that she has trimmed herself and helps with the pain but hoping for more permanent solution. She also relates history of bunion correction and concern for hammertoes on the left that could be causing her trouble . Denies any other pedal complaints. Denies n/v/f/c.   Past Medical History:  Diagnosis Date   Anxiety    Arthritis    Brain bleed (HCC) 09/2019   after fall in yard, no current deficits, no further medical tx needed per [facility] self resolved   GERD (gastroesophageal reflux disease)    History of broken nose 09/2019   after fall in yard with subsequent brain bleed   Hypertension    Hypothyroidism    Macular degeneration, dry    right side, slight   PONV (postoperative nausea and vomiting)     Objective:  Physical Exam: Vascular: DP/PT pulses 2/4 bilateral. CFT <3 seconds. Normal hair growth on digits. No edema.  Skin. No lacerations or abrasions bilateral feet. Hyperkeratotic cored lesion sub first and third metatarsal on left with disruption of skin lines.  Musculoskeletal: MMT 5/5 bilateral lower extremities in DF, PF, Inversion and Eversion. Deceased ROM in DF of ankle joint. Flexible hammered fourth and third digit on the left.  Neurological: Sensation intact to light touch.   Assessment:   1. Hammertoe of left foot   2. Benign neoplasm of skin of foot, left      Plan:  Patient was evaluated and treated and all questions answered. -Discussed benigns skin lesions  with patient and treatment options.  -Hyperkeratotic tissue was debrided with chisel without incident.  -Applied salycylic acid treatment to area with dressing. Advised to remove bandaging tomorrow.  -Encouraged daily moisturizing -Discussed use of pumice  stone -Advised good supportive shoes and inserts -X-rays reviewed. Retained hardware from previous bunion surgery. Hammered third and fourth digits on the left.  -Educated on hammertoes and treatment options  -Discussed padding including toe caps and crest pads.  -Discussed need for potential surgery if pain does not improved. Discussed possible tenotomy procedure to straighten and possible help with callus formation on ball of foot.  -Patient to follow-up as needed for tenotomy procedure.    Louann Sjogren, DPM

## 2023-08-26 ENCOUNTER — Encounter: Payer: Self-pay | Admitting: Podiatry

## 2023-08-26 ENCOUNTER — Ambulatory Visit (INDEPENDENT_AMBULATORY_CARE_PROVIDER_SITE_OTHER): Payer: Medicare Other | Admitting: Podiatry

## 2023-08-26 DIAGNOSIS — M2042 Other hammer toe(s) (acquired), left foot: Secondary | ICD-10-CM | POA: Diagnosis not present

## 2023-08-26 NOTE — Progress Notes (Signed)
  Subjective:  Patient ID: Cathy Fitzpatrick, female    DOB: 1946-01-10,   MRN: [id number]  No chief complaint on file.   77 y.o. female presents for tenotomies of left third and fourth digits. No other complaints today.  Denies any other pedal complaints. Denies n/v/f/c.   Past Medical History:  Diagnosis Date   Anxiety    Arthritis    Brain bleed (HCC) 09/2019   after fall in yard, no current deficits, no further medical tx needed per [facility] Neurology self resolved   GERD (gastroesophageal reflux disease)    History of broken nose 09/2019   after fall in yard with subsequent brain bleed   Hypertension    Hypothyroidism    Macular degeneration, dry    right side, slight   PONV (postoperative nausea and vomiting)     Objective:  Physical Exam: Vascular: DP/PT pulses 2/4 bilateral. CFT <3 seconds. Normal hair growth on digits. No edema.  Skin. No lacerations or abrasions bilateral feet. Hyperkeratotic cored lesion sub first and third metatarsal on left with disruption of skin lines.  Musculoskeletal: MMT 5/5 bilateral lower extremities in DF, PF, Inversion and Eversion. Deceased ROM in DF of ankle joint. Flexible hammered fourth and third digit on the left.  Neurological: Sensation intact to light touch.   Assessment:   1. Hammertoe of left foot       Plan:  Patient was evaluated and treated and all questions answered. -X-rays reviewed. Retained hardware from previous bunion surgery. Hammered third and fourth digits on the left.  -Educated on hammertoes and treatment options  -Tentomy procedure below.   Procedure: Flexor Tenotomy Indication for Procedure: toe with semi-reducible hammertoe with distal tip ulceration. Flexor tenotomy indicated to alleviate contracture, reduce pressure, and enhance healing of the ulceration. Location: left, 3rd toe, 4th toe Anesthesia: Lidocaine 2% plain; 3mL digital block Instrumentation: 18 gauge needle  Technique: The toe was  anesthetized as above and prepped in the usual fashion. The toe was exanquinated and a tourniquet was secured at the base of the toe. A 18 gauge needle was then used to make a transverse incision over the plantar aspect of the distal interphalangeal joint. The flexor tendon was incised with noted release of the hammertoe deformity. The incision was then irrigated and dressed with sterile dressing. Patient tolerated the procedure well. Dressing: Dry, sterile, compression dressing. Disposition: Patient tolerated procedure well. Patient to return in 1 week for follow-up.     Louann Sjogren, DPM

## 2023-08-28 ENCOUNTER — Ambulatory Visit: Payer: Medicare Other | Admitting: Podiatry

## 2023-09-03 ENCOUNTER — Encounter: Payer: Self-pay | Admitting: Podiatry

## 2023-09-03 ENCOUNTER — Ambulatory Visit (INDEPENDENT_AMBULATORY_CARE_PROVIDER_SITE_OTHER): Payer: Medicare Other | Admitting: Podiatry

## 2023-09-03 DIAGNOSIS — M2042 Other hammer toe(s) (acquired), left foot: Secondary | ICD-10-CM

## 2023-09-03 NOTE — Progress Notes (Signed)
  Subjective:  Patient ID: Cathy Fitzpatrick, female    DOB: [birth year],   MRN: [id number]  Chief Complaint  Patient presents with   Routine Post Op    Pt  presents for  a follow up of tenotomies of left third and fourth digits states she is doing a lot better.    77 y.o. female presents for follow-up  tenotomies of left third and fourth digits. No other complaints today.  Denies any other pedal complaints. Denies n/v/f/c.   Past Medical History:  Diagnosis Date   Anxiety    Arthritis    Brain bleed (HCC) 09/2019   after fall in yard, no current deficits, no further medical tx needed per [facility] Neurology self resolved   GERD (gastroesophageal reflux disease)    History of broken nose 09/2019   after fall in yard with subsequent brain bleed   Hypertension    Hypothyroidism    Macular degeneration, dry    right side, slight   PONV (postoperative nausea and vomiting)     Objective:  Physical Exam: Vascular: DP/PT pulses 2/4 bilateral. CFT <3 seconds. Normal hair growth on digits. No edema.  Skin. No lacerations or abrasions bilateral feet. Hyperkeratotic cored lesion sub first and third metatarsal on left with disruption of skin lines.  Musculoskeletal: MMT 5/5 bilateral lower extremities in DF, PF, Inversion and Eversion. Deceased ROM in DF of ankle joint. Flexible hammered fourth and third digit on the left improved positioning noted today.  Neurological: Sensation intact to light touch.   Assessment:   1. Hammertoe of left foot        Plan:  Patient was evaluated and treated and all questions answered. Toe was evaluated and appears to be healing well.  Discussed continue ROM exercises for the toes.   Patient to follow-up as needed.       Louann Sjogren, DPM

## 2023-11-13 ENCOUNTER — Ambulatory Visit: Payer: Medicare Other | Admitting: Podiatry

## 2023-11-29 ENCOUNTER — Ambulatory Visit (INDEPENDENT_AMBULATORY_CARE_PROVIDER_SITE_OTHER): Payer: Medicare Other | Admitting: Podiatry

## 2023-11-29 ENCOUNTER — Encounter: Payer: Self-pay | Admitting: Podiatry

## 2023-11-29 DIAGNOSIS — B351 Tinea unguium: Secondary | ICD-10-CM | POA: Diagnosis not present

## 2023-11-29 NOTE — Progress Notes (Signed)
  Subjective:  Patient ID: Cathy Fitzpatrick, female    DOB: February 23, 1946,   MRN: [id number]  No chief complaint on file.   78 y.o. female presents for concern of bilateral great toenail thicken ess and discoloration. Has been this way for years and worsened. Toes relate a history of a run in small shoes and toenails fell off in the past. She denies any current treatments.  . Denies any other pedal complaints. Denies n/v/f/c.   Past Medical History:  Diagnosis Date   Anxiety    Arthritis    Brain bleed (HCC) 09/2019   after fall in yard, no current deficits, no further medical tx needed per [facility] Neurology self resolved   GERD (gastroesophageal reflux disease)    History of broken nose 09/2019   after fall in yard with subsequent brain bleed   Hypertension    Hypothyroidism    Macular degeneration, dry    right side, slight   PONV (postoperative nausea and vomiting)     Objective:  Physical Exam: Vascular: DP/PT pulses 2/4 bilateral. CFT <3 seconds. Normal hair growth on digits. No edema.  Skin. No lacerations or abrasions bilateral feet. Bilateral hallux nails are thickened dystrophic with subungual debris.  Musculoskeletal: MMT 5/5 bilateral lower extremities in DF, PF, Inversion and Eversion. Deceased ROM in DF of ankle joint.  Neurological: Sensation intact to light touch.   Assessment:   1. Onychomycosis      Plan:  Patient was evaluated and treated and all questions answered. -Examined patient -Discussed treatment options for painful dystrophic nails  -Clinical picture and Fungal culture was obtained by removing a portion of the hard nail itself from each of the involved toenails using a sterile nail nipper and sent to [facility] lab. Patient tolerated the biopsy procedure well without discomfort or need for anesthesia.  -Discussed fungal nail treatment options including oral, topical, and laser treatments.  -Patient to return in 4 weeks for follow up evaluation and  discussion of fungal culture results or sooner if symptoms worsen.   Louann Sjogren, DPM

## 2023-11-29 NOTE — Addendum Note (Signed)
 Addended by: Daryel November on: 11/29/2023 01:32 PM   Modules accepted: Orders

## 2023-11-29 NOTE — Addendum Note (Signed)
 Addended by: Daryel November on: 11/29/2023 01:13 PM   Modules accepted: Orders

## 2023-12-09 ENCOUNTER — Other Ambulatory Visit: Payer: Self-pay | Admitting: Podiatry

## 2023-12-26 ENCOUNTER — Ambulatory Visit (INDEPENDENT_AMBULATORY_CARE_PROVIDER_SITE_OTHER): Admitting: Podiatry

## 2023-12-26 DIAGNOSIS — B351 Tinea unguium: Secondary | ICD-10-CM | POA: Diagnosis not present

## 2023-12-26 MED ORDER — TERBINAFINE HCL 250 MG PO TABS: 250.0000 mg | ORAL_TABLET | Freq: Every day | ORAL | 2 refills | Status: AC

## 2023-12-26 NOTE — Progress Notes (Signed)
  Subjective:  Patient ID: Cathy Fitzpatrick, female    DOB: 06-19-1946,   MRN: [id number]  No chief complaint on file.   78 y.o. female presents for  follow-up of fungal nails and to review culture results.   . Denies any other pedal complaints. Denies n/v/f/c.   Past Medical History:  Diagnosis Date   Anxiety    Arthritis    Brain bleed (HCC) 09/2019   after fall in yard, no current deficits, no further medical tx needed per [facility] self resolved   GERD (gastroesophageal reflux disease)    History of broken nose 09/2019   after fall in yard with subsequent brain bleed   Hypertension    Hypothyroidism    Macular degeneration, dry    right side, slight   PONV (postoperative nausea and vomiting)     Objective:  Physical Exam: Vascular: DP/PT pulses 2/4 bilateral. CFT <3 seconds. Normal hair growth on digits. No edema.  Skin. No lacerations or abrasions bilateral feet. Bilateral hallux nails are thickened dystrophic with subungual debris.  Musculoskeletal: MMT 5/5 bilateral lower extremities in DF, PF, Inversion and Eversion. Deceased ROM in DF of ankle joint.  Neurological: Sensation intact to light touch.   Assessment:   1. Onychomycosis       Plan:  Patient was evaluated and treated and all questions answered. -Examined patient -Discussed treatment options for painful dystrophic nails  -Culture positive for fungus.  -Discussed fungal nail treatment options including oral, topical, and laser treatments.  -Patient will like to tryl lamisil. LFTS wnl. Lamsil prescribed 90 days -Patient to return in 3 months for fungal nail   Louann Sjogren, DPM

## 2024-03-19 ENCOUNTER — Ambulatory Visit (INDEPENDENT_AMBULATORY_CARE_PROVIDER_SITE_OTHER): Admitting: Podiatry

## 2024-03-19 ENCOUNTER — Encounter: Payer: Self-pay | Admitting: Podiatry

## 2024-03-19 DIAGNOSIS — B351 Tinea unguium: Secondary | ICD-10-CM

## 2024-03-19 MED ORDER — TERBINAFINE HCL 250 MG PO TABS: 250.0000 mg | ORAL_TABLET | Freq: Every day | ORAL | 0 refills | Status: AC

## 2024-03-19 NOTE — Progress Notes (Signed)
  Subjective:  Patient ID: Cathy Fitzpatrick, female    DOB: 30-Mar-1946,   MRN: [id number]  Chief Complaint  Patient presents with   Nail Problem    My toenails are growing.  I'm still taking the Terbinafine .  I'm on my last round of it.    78 y.o. female presents for  follow-up of fungal nails. She is still taking terbinafine  and on last round and relates seeing some improvement but still has some significant changes.   . Denies any other pedal complaints. Denies n/v/f/c.   Past Medical History:  Diagnosis Date   Anxiety    Arthritis    Brain bleed (HCC) 09/2019   after fall in yard, no current deficits, no further medical tx needed per [facility] self resolved   GERD (gastroesophageal reflux disease)    History of broken nose 09/2019   after fall in yard with subsequent brain bleed   Hypertension    Hypothyroidism    Macular degeneration, dry    right side, slight   PONV (postoperative nausea and vomiting)     Objective:  Physical Exam: Vascular: DP/PT pulses 2/4 bilateral. CFT <3 seconds. Normal hair growth on digits. No edema.  Skin. No lacerations or abrasions bilateral feet. Bilateral hallux nails are thickened dystrophic with subungual debris. New nail underlying old nail growing in more clearly. Musculoskeletal: MMT 5/5 bilateral lower extremities in DF, PF, Inversion and Eversion. Deceased ROM in DF of ankle joint.  Neurological: Sensation intact to light touch.   Assessment:   1. Onychomycosis        Plan:  Patient was evaluated and treated and all questions answered. -Examined patient -Discussed treatment options for painful dystrophic nails  -Culture positive for fungus.  -Discussed fungal nail treatment options including oral, topical, and laser treatments.  -Does appear to be some improvement. Will try one month pulse. Patient will finish course pause for a month and then resume one more month of treatment.  -Patient to return in 3 months for  fungal nail   Asberry Failing, DPM

## 2024-06-19 ENCOUNTER — Encounter: Payer: Self-pay | Admitting: Podiatry

## 2024-06-19 ENCOUNTER — Ambulatory Visit: Admitting: Podiatry

## 2024-06-19 DIAGNOSIS — B351 Tinea unguium: Secondary | ICD-10-CM | POA: Diagnosis not present

## 2024-06-19 NOTE — Progress Notes (Signed)
  Subjective:  Patient ID: Cathy Fitzpatrick, female    DOB: 07-02-1946,   MRN: [id number]  Chief Complaint  Patient presents with   Nail Problem    I'm here to see if the medicine is working on my toenails.  I think it is.    78 y.o. female presents for  follow-up of fungal nails. She is still taking terbinafine  and on last round and relates seeing some improvement. .   . Denies any other pedal complaints. Denies n/v/f/c.   Past Medical History:  Diagnosis Date   Anxiety    Arthritis    Brain bleed (HCC) 09/2019   after fall in yard, no current deficits, no further medical tx needed per [facility] self resolved   GERD (gastroesophageal reflux disease)    History of broken nose 09/2019   after fall in yard with subsequent brain bleed   Hypertension    Hypothyroidism    Macular degeneration, dry    right side, slight   PONV (postoperative nausea and vomiting)     Objective:  Physical Exam: Vascular: DP/PT pulses 2/4 bilateral. CFT <3 seconds. Normal hair growth on digits. No edema.  Skin. No lacerations or abrasions bilateral feet. Bilateral hallux nails are thickened dystrophic with subungual debris. New nail underlying old nail growing in more clearly. Musculoskeletal: MMT 5/5 bilateral lower extremities in DF, PF, Inversion and Eversion. Deceased ROM in DF of ankle joint.  Neurological: Sensation intact to light touch.   Assessment:   1. Onychomycosis         Plan:  Patient was evaluated and treated and all questions answered. -Examined patient -Discussed treatment options for painful dystrophic nails  -Culture positive for fungus.  -Discussed fungal nail treatment options including oral, topical, and laser treatments.  -Does appear to be improvement and no additional medication necessary.  -Patient to return as needed  Asberry Failing, DPM
